# Patient Record
Sex: Female | Born: 1970 | Race: White | Hispanic: No | Marital: Married
Health system: Midwestern US, Academic
[De-identification: ages and names within clinical notes are randomized; demographics above are authoritative.]

---

## 2017-09-16 ENCOUNTER — Encounter: Admit: 2017-09-16 | Discharge: 2017-09-16 | Payer: 59

## 2017-09-16 DIAGNOSIS — S0993XA Unspecified injury of face, initial encounter: ICD-10-CM

## 2017-09-16 DIAGNOSIS — G8911 Acute pain due to trauma: Secondary | ICD-10-CM

## 2017-09-16 MED ORDER — AMPICILLIN/SULBACTAM 3G/100ML NS IVPB (MB+)
3 g | INTRAVENOUS | 0 refills | Status: DC
Start: 2017-09-16 — End: 2017-09-20
  Administered 2017-09-17 – 2017-09-20 (×26): 3 g via INTRAVENOUS

## 2017-09-16 MED ORDER — TETANUS-DIPHTHERIA TOXOIDS-TD 2-2 LF UNIT/0.5 ML IM SUSP
.5 mL | Freq: Once | INTRAMUSCULAR | 0 refills | Status: DC
Start: 2017-09-16 — End: 2017-09-17

## 2017-09-16 MED ORDER — ONDANSETRON HCL (PF) 4 MG/2 ML IJ SOLN
4-8 mg | INTRAVENOUS | 0 refills | Status: DC | PRN
Start: 2017-09-16 — End: 2017-09-21
  Administered 2017-09-17 – 2017-09-19 (×4): 4 mg via INTRAVENOUS

## 2017-09-16 MED ORDER — FENTANYL CITRATE (PF) 50 MCG/ML IJ SOLN
25-50 ug | INTRAVENOUS | 0 refills | Status: DC | PRN
Start: 2017-09-16 — End: 2017-09-17
  Administered 2017-09-17 (×2): 50 ug via INTRAVENOUS

## 2017-09-16 MED ORDER — CETIRIZINE 10 MG PO TAB
10 mg | Freq: Every day | ORAL | 0 refills | Status: DC
Start: 2017-09-16 — End: 2017-09-21
  Administered 2017-09-18 – 2017-09-20 (×3): 10 mg via ORAL

## 2017-09-17 ENCOUNTER — Encounter: Admit: 2017-09-17 | Discharge: 2017-09-17 | Payer: 59

## 2017-09-17 LAB — CBC AND DIFF
Lab: 0 % (ref 0–5)
Lab: 0 % (ref >60)
Lab: 0 10*3/uL (ref 0–0.20)
Lab: 0 10*3/uL (ref 0–0.45)
Lab: 0.8 10*3/uL (ref 0–0.80)
Lab: 1 10*3/uL (ref 1.0–4.8)
Lab: 10 K/UL — ABNORMAL HIGH (ref >60)
Lab: 12 % (ref 11–15)
Lab: 12 10*3/uL — ABNORMAL HIGH (ref 4.5–11.0)
Lab: 13 g/dL — ABNORMAL HIGH (ref 12.0–15.0)
Lab: 197 10*3/uL (ref 150–400)
Lab: 34 g/dL (ref 32.0–36.0)
Lab: 4 M/UL (ref 4.0–5.0)
Lab: 6 % (ref 4–12)
Lab: 8 % — ABNORMAL LOW (ref 24–44)

## 2017-09-17 LAB — PTT (APTT): Lab: 18 s — ABNORMAL LOW (ref 24.0–36.5)

## 2017-09-17 LAB — CBC
Lab: 12 % (ref 11–15)
Lab: 14 K/UL — ABNORMAL HIGH (ref 4.5–11.0)
Lab: 197 K/UL (ref >60)
Lab: 33 pg (ref 26–34)
Lab: 34 g/dL (ref 32.0–36.0)
Lab: 40 % (ref 36–45)

## 2017-09-17 LAB — MAGNESIUM
Lab: 1.9 mg/dL (ref 1.6–2.6)
Lab: 1.9 mg/dL (ref 1.6–2.6)

## 2017-09-17 LAB — COMPREHENSIVE METABOLIC PANEL: Lab: 136 MMOL/L — ABNORMAL LOW (ref 137–147)

## 2017-09-17 LAB — PROTIME INR (PT): Lab: 1 (ref 0.8–1.2)

## 2017-09-17 LAB — PHOSPHORUS
Lab: 2.7 mg/dL (ref 2.0–4.5)
Lab: 3.1 mg/dL (ref 2.0–4.5)

## 2017-09-17 LAB — BASIC METABOLIC PANEL: Lab: 134 MMOL/L — ABNORMAL LOW (ref 137–147)

## 2017-09-17 LAB — IONIZED CALCIUM
Lab: 1 MMOL/L (ref 1.0–1.3)
Lab: 1.1 MMOL/L (ref >60)

## 2017-09-17 LAB — LACTIC ACID(LACTATE): Lab: 1.4 MMOL/L (ref 0.5–2.0)

## 2017-09-17 MED ORDER — PROMETHAZINE 25 MG/ML IJ SOLN
12.5 mg | INTRAVENOUS | 0 refills | Status: DC | PRN
Start: 2017-09-17 — End: 2017-09-19
  Administered 2017-09-17 – 2017-09-19 (×3): 12.5 mg via INTRAVENOUS

## 2017-09-17 MED ORDER — KETOROLAC 30 MG/ML (1 ML) IJ SOLN
30 mg | INTRAVENOUS | 0 refills | Status: CP
Start: 2017-09-17 — End: ?
  Administered 2017-09-17 – 2017-09-18 (×5): 30 mg via INTRAVENOUS

## 2017-09-17 MED ORDER — METHOCARBAMOL 750 MG PO TAB
750 mg | Freq: Two times a day (BID) | ORAL | 0 refills | Status: DC
Start: 2017-09-17 — End: 2017-09-21
  Administered 2017-09-17 – 2017-09-20 (×6): 750 mg via ORAL

## 2017-09-17 MED ORDER — LIDOCAINE-EPINEPHRINE 1 %-1:100,000 IJ SOLN
20 mL | Freq: Once | INTRAMUSCULAR | 0 refills | Status: CP
Start: 2017-09-17 — End: ?
  Administered 2017-09-17: 07:00:00 20 mL via INTRAMUSCULAR

## 2017-09-17 MED ORDER — DIPHTH,PERTUS(ACELL),TETANUS 2.5-8-5 LF-MCG-LF/0.5ML IM SUSP
.5 mL | Freq: Once | INTRAMUSCULAR | 0 refills | Status: CP
Start: 2017-09-17 — End: ?
  Administered 2017-09-17: 15:00:00 0.5 mL via INTRAMUSCULAR

## 2017-09-17 MED ORDER — SENNOSIDES 8.6 MG PO TAB
1 | Freq: Two times a day (BID) | ORAL | 0 refills | Status: DC
Start: 2017-09-17 — End: 2017-09-21
  Administered 2017-09-18 – 2017-09-20 (×4): 1 via ORAL

## 2017-09-17 MED ORDER — HYDROCODONE-ACETAMINOPHEN 5-325 MG PO TAB
1-2 | ORAL | 0 refills | Status: DC | PRN
Start: 2017-09-17 — End: 2017-09-19
  Administered 2017-09-17 – 2017-09-18 (×4): 1 via ORAL
  Administered 2017-09-18: 04:00:00 2 via ORAL
  Administered 2017-09-19 (×2): 1 via ORAL

## 2017-09-17 MED ORDER — ENOXAPARIN 30 MG/0.3 ML SC SYRG
30 mg | Freq: Two times a day (BID) | SUBCUTANEOUS | 0 refills | Status: DC
Start: 2017-09-17 — End: 2017-09-21
  Administered 2017-09-17 – 2017-09-20 (×6): 30 mg via SUBCUTANEOUS

## 2017-09-17 MED ORDER — OXYCODONE 5 MG PO TAB
5-10 mg | ORAL | 0 refills | Status: DC | PRN
Start: 2017-09-17 — End: 2017-09-17
  Administered 2017-09-17: 12:00:00 5 mg via ORAL

## 2017-09-17 MED ORDER — POTASSIUM PHOSPHATE IVPB-STD
15 MMOL | Freq: Once | INTRAVENOUS | 0 refills | Status: CP
Start: 2017-09-17 — End: ?
  Administered 2017-09-17 (×2): 15 mmol via INTRAVENOUS

## 2017-09-17 MED ORDER — FENTANYL CITRATE (PF) 50 MCG/ML IJ SOLN
25-50 ug | INTRAVENOUS | 0 refills | Status: DC | PRN
Start: 2017-09-17 — End: 2017-09-17

## 2017-09-17 MED ORDER — POTASSIUM CHLORIDE 20 MEQ PO TBTQ
20 meq | Freq: Once | ORAL | 0 refills | Status: CP
Start: 2017-09-17 — End: ?
  Administered 2017-09-17: 13:00:00 20 meq via ORAL

## 2017-09-17 MED ORDER — FENTANYL CITRATE (PF) 50 MCG/ML IJ SOLN
25 ug | INTRAVENOUS | 0 refills | Status: DC | PRN
Start: 2017-09-17 — End: 2017-09-17

## 2017-09-17 MED ORDER — WATER FOR IRRIGATION, STERILE IR SOLN
Freq: Once | 0 refills | Status: CP
Start: 2017-09-17 — End: ?
  Administered 2017-09-17: 07:00:00 1000.000 mL

## 2017-09-17 MED ORDER — ACETAMINOPHEN 325 MG PO TAB
650 mg | ORAL | 0 refills | Status: DC
Start: 2017-09-17 — End: 2017-09-17
  Administered 2017-09-17 (×2): 650 mg via ORAL

## 2017-09-17 MED ORDER — POLYETHYLENE GLYCOL 3350 17 GRAM PO PWPK
1 | Freq: Every day | ORAL | 0 refills | Status: DC
Start: 2017-09-17 — End: 2017-09-21
  Administered 2017-09-20: 14:00:00 17 g via ORAL

## 2017-09-17 MED ORDER — SCOPOLAMINE BASE 1 MG OVER 3 DAYS TD PT3D
1 | TRANSDERMAL | 0 refills | Status: DC
Start: 2017-09-17 — End: 2017-09-20
  Administered 2017-09-17: 19:00:00 1 via TRANSDERMAL

## 2017-09-17 MED ADMIN — SODIUM CHLORIDE 0.9 % IV SOLP [27838]: 1000 mL | INTRAVENOUS | @ 07:00:00 | Stop: 2017-09-17 | NDC 00338004904

## 2017-09-18 ENCOUNTER — Inpatient Hospital Stay: Admit: 2017-09-19 | Discharge: 2017-09-19 | Payer: 59

## 2017-09-18 ENCOUNTER — Encounter: Admit: 2017-09-18 | Discharge: 2017-09-18 | Payer: 59

## 2017-09-18 DIAGNOSIS — S02652B Fracture of angle of left mandible, initial encounter for open fracture: Principal | ICD-10-CM

## 2017-09-19 ENCOUNTER — Inpatient Hospital Stay: Admit: 2017-09-19 | Discharge: 2017-09-19 | Payer: 59

## 2017-09-19 ENCOUNTER — Encounter: Admit: 2017-09-19 | Discharge: 2017-09-19 | Payer: 59

## 2017-09-19 ENCOUNTER — Inpatient Hospital Stay
Admit: 2017-09-17 | Discharge: 2017-09-20 | Disposition: A | Discharge status: Home / Self Care | Source: Other Acute Inpatient Hospital

## 2017-09-19 DIAGNOSIS — S02652B Fracture of angle of left mandible, initial encounter for open fracture: Principal | ICD-10-CM

## 2017-09-19 LAB — PREGNANCY TEST-URINE
Lab: 1
Lab: NEGATIVE

## 2017-09-19 MED ORDER — DEXTRAN 70-HYPROMELLOSE (PF) 0.1-0.3 % OP DPET
0 refills | Status: DC
Start: 2017-09-19 — End: 2017-09-19
  Administered 2017-09-19: 22:00:00 2 [drp] via OPHTHALMIC

## 2017-09-19 MED ORDER — DEXAMETHASONE SODIUM PHOSPHATE 4 MG/ML IJ SOLN
INTRAVENOUS | 0 refills | Status: DC
Start: 2017-09-19 — End: 2017-09-19
  Administered 2017-09-19: 22:00:00 4 mg via INTRAVENOUS

## 2017-09-19 MED ORDER — BACITRACIN ZINC 500 UNIT/GRAM TP OINT
0 refills | Status: DC
Start: 2017-09-19 — End: 2017-09-19
  Administered 2017-09-19: 23:00:00 1 via TOPICAL

## 2017-09-19 MED ORDER — HYDRALAZINE 20 MG/ML IJ SOLN
10 mg | Freq: Once | INTRAVENOUS | 0 refills | Status: CP
Start: 2017-09-19 — End: ?
  Administered 2017-09-20: 05:00:00 10 mg via INTRAVENOUS

## 2017-09-19 MED ORDER — BACITRACIN 50,000 UNIT IM SOLR
0 refills | Status: DC
Start: 2017-09-19 — End: 2017-09-19
  Administered 2017-09-19: 23:00:00 50000 [IU]

## 2017-09-19 MED ORDER — TRAMADOL 50 MG PO TAB
50-100 mg | ORAL | 0 refills | Status: DC | PRN
Start: 2017-09-19 — End: 2017-09-21
  Administered 2017-09-20: 18:00:00 100 mg via ORAL
  Administered 2017-09-20 (×2): 50 mg via ORAL

## 2017-09-19 MED ORDER — MIDAZOLAM 1 MG/ML IJ SOLN
INTRAVENOUS | 0 refills | Status: DC
Start: 2017-09-19 — End: 2017-09-19
  Administered 2017-09-19: 22:00:00 2 mg via INTRAVENOUS

## 2017-09-19 MED ORDER — DIPHENHYDRAMINE HCL 50 MG/ML IJ SOLN
25 mg | Freq: Once | INTRAVENOUS | 0 refills | Status: DC | PRN
Start: 2017-09-19 — End: 2017-09-20

## 2017-09-19 MED ORDER — ROCURONIUM 10 MG/ML IV SOLN
INTRAVENOUS | 0 refills | Status: DC
Start: 2017-09-19 — End: 2017-09-19
  Administered 2017-09-19: 22:00:00 30 mg via INTRAVENOUS

## 2017-09-19 MED ORDER — HALOPERIDOL LACTATE 5 MG/ML IJ SOLN
1 mg | Freq: Once | INTRAVENOUS | 0 refills | Status: CP | PRN
Start: 2017-09-19 — End: ?
  Administered 2017-09-19: 23:00:00 1 mg via INTRAVENOUS

## 2017-09-19 MED ORDER — ACETAMINOPHEN 325 MG PO TAB
650 mg | ORAL | 0 refills | Status: DC
Start: 2017-09-19 — End: 2017-09-21
  Administered 2017-09-20 (×4): 650 mg via ORAL

## 2017-09-19 MED ORDER — LABETALOL 5 MG/ML IV SYRG
5 mg | Freq: Once | INTRAVENOUS | 0 refills | Status: CP
Start: 2017-09-19 — End: ?
  Administered 2017-09-20: 01:00:00 5 mg via INTRAVENOUS

## 2017-09-19 MED ORDER — LIDOCAINE/BUPIVACAINE/EPINEPHRINE SYR (KRIET) (OR)
0 refills | Status: DC
Start: 2017-09-19 — End: 2017-09-19
  Administered 2017-09-19 (×3): 3 mL via INTRAMUSCULAR

## 2017-09-19 MED ORDER — ONDANSETRON HCL (PF) 4 MG/2 ML IJ SOLN
INTRAVENOUS | 0 refills | Status: DC
Start: 2017-09-19 — End: 2017-09-19
  Administered 2017-09-19: 22:00:00 4 mg via INTRAVENOUS

## 2017-09-19 MED ORDER — HYDROMORPHONE (PF) 2 MG/ML IJ SYRG
1-2 mg | Freq: Once | INTRAVENOUS | 0 refills | Status: CP | PRN
Start: 2017-09-19 — End: ?
  Administered 2017-09-19: 23:00:00 1 mg via INTRAVENOUS

## 2017-09-19 MED ORDER — ACETAMINOPHEN 325 MG PO TAB
650 mg | ORAL | 0 refills | Status: DC | PRN
Start: 2017-09-19 — End: 2017-09-20
  Administered 2017-09-19: 21:00:00 650 mg via ORAL

## 2017-09-19 MED ORDER — REMIFENTANYL 1000MCG IN NS 20ML (OR)
0 refills | Status: DC
Start: 2017-09-19 — End: 2017-09-19
  Administered 2017-09-19 (×2): .05 ug/kg/min via INTRAVENOUS

## 2017-09-19 MED ORDER — LACTATED RINGERS IV SOLP
1000 mL | INTRAVENOUS | 0 refills | Status: DC
Start: 2017-09-19 — End: 2017-09-20

## 2017-09-19 MED ORDER — ACETAMINOPHEN 325 MG PO TAB
650 mg | ORAL | 0 refills | Status: DC | PRN
Start: 2017-09-19 — End: 2017-09-19
  Administered 2017-09-19: 15:00:00 650 mg via ORAL

## 2017-09-19 MED ORDER — DEXMEDETOMIDINE# 4MCG/ML IV SOLN
0 refills | Status: DC
Start: 2017-09-19 — End: 2017-09-19
  Administered 2017-09-19: 22:00:00 12 ug via INTRAVENOUS
  Administered 2017-09-19: 22:00:00 8 ug via INTRAVENOUS

## 2017-09-19 MED ORDER — PROMETHAZINE 25 MG PO TAB
25 mg | ORAL | 0 refills | Status: DC | PRN
Start: 2017-09-19 — End: 2017-09-21
  Administered 2017-09-20 (×2): 25 mg via ORAL

## 2017-09-19 MED ORDER — PROMETHAZINE 25 MG/ML IJ SOLN
6.25 mg | INTRAVENOUS | 0 refills | Status: DC | PRN
Start: 2017-09-19 — End: 2017-09-20

## 2017-09-19 MED ORDER — PROPOFOL INJ 10 MG/ML IV VIAL
0 refills | Status: DC
Start: 2017-09-19 — End: 2017-09-19
  Administered 2017-09-19 (×4): 50 mg via INTRAVENOUS

## 2017-09-19 MED ORDER — FENTANYL CITRATE (PF) 50 MCG/ML IJ SOLN
25 ug | INTRAVENOUS | 0 refills | Status: DC | PRN
Start: 2017-09-19 — End: 2017-09-20

## 2017-09-19 MED ORDER — SUGAMMADEX 100 MG/ML IV SOLN
INTRAVENOUS | 0 refills | Status: DC
Start: 2017-09-19 — End: 2017-09-19
  Administered 2017-09-19: 23:00:00 133 mg via INTRAVENOUS

## 2017-09-19 MED ORDER — PROPOFOL 10 MG/ML IV EMUL (INFUSION)(AM)(OR)
0 refills | Status: DC
Start: 2017-09-19 — End: 2017-09-19
  Administered 2017-09-19: 22:00:00 150 ug/kg/min via INTRAVENOUS

## 2017-09-19 MED ORDER — PHENYLEPHRINE HCL 1 % NA SPRY
0 refills | Status: DC
Start: 2017-09-19 — End: 2017-09-19
  Administered 2017-09-19: 22:00:00 2 via NASAL

## 2017-09-19 MED ORDER — FENTANYL CITRATE (PF) 50 MCG/ML IJ SOLN
50 ug | INTRAVENOUS | 0 refills | Status: DC | PRN
Start: 2017-09-19 — End: 2017-09-20
  Administered 2017-09-19 – 2017-09-20 (×4): 50 ug via INTRAVENOUS

## 2017-09-19 MED ORDER — APREPITANT 125 MG PO CAP
125 mg | Freq: Once | ORAL | 0 refills | Status: CP
Start: 2017-09-19 — End: ?
  Administered 2017-09-19: 21:00:00 125 mg via ORAL

## 2017-09-19 MED ORDER — FENTANYL CITRATE (PF) 50 MCG/ML IJ SOLN
25-50 ug | INTRAVENOUS | 0 refills | Status: DC | PRN
Start: 2017-09-19 — End: 2017-09-20
  Administered 2017-09-20: 05:00:00 50 ug via INTRAVENOUS
  Administered 2017-09-20: 02:00:00 25 ug via INTRAVENOUS
  Administered 2017-09-20: 13:00:00 50 ug via INTRAVENOUS

## 2017-09-19 MED ADMIN — LACTATED RINGERS IV SOLP [4318]: 1000 mL | INTRAVENOUS | @ 21:00:00 | Stop: 2017-09-19 | NDC 00338011704

## 2017-09-20 ENCOUNTER — Encounter: Admit: 2017-09-20 | Discharge: 2017-09-20 | Payer: 59

## 2017-09-20 ENCOUNTER — Inpatient Hospital Stay: Admit: 2017-09-18 | Discharge: 2017-09-18 | Payer: 59

## 2017-09-20 DIAGNOSIS — G8911 Acute pain due to trauma: ICD-10-CM

## 2017-09-20 DIAGNOSIS — S0181XA Laceration without foreign body of other part of head, initial encounter: ICD-10-CM

## 2017-09-20 DIAGNOSIS — S060X0A Concussion without loss of consciousness, initial encounter: ICD-10-CM

## 2017-09-20 DIAGNOSIS — M264 Malocclusion, unspecified: ICD-10-CM

## 2017-09-20 DIAGNOSIS — S02652B Fracture of angle of left mandible, initial encounter for open fracture: Principal | ICD-10-CM

## 2017-09-20 MED ORDER — METHOCARBAMOL 750 MG PO TAB
750 mg | ORAL_TABLET | Freq: Three times a day (TID) | ORAL | 0 refills | Status: SS | PRN
Start: 2017-09-20 — End: 2018-04-04

## 2017-09-20 MED ORDER — METHOCARBAMOL 750 MG PO TAB
750 mg | ORAL_TABLET | Freq: Three times a day (TID) | ORAL | 0 refills | Status: AC | PRN
Start: 2017-09-20 — End: 2017-09-20
  Filled 2017-09-20 (×2): qty 15, 5d supply, fill #1

## 2017-09-20 MED ORDER — CEPHALEXIN 500 MG PO CAP
500 mg | ORAL_CAPSULE | Freq: Four times a day (QID) | ORAL | 0 refills | Status: AC
Start: 2017-09-20 — End: 2017-09-20
  Filled 2017-09-20 (×2): qty 28, 7d supply, fill #1

## 2017-09-20 MED ORDER — TRAMADOL 50 MG PO TAB
50-100 mg | ORAL_TABLET | ORAL | 0 refills | Status: AC | PRN
Start: 2017-09-20 — End: 2018-01-12
  Filled 2017-09-20 (×2): qty 45, 7d supply, fill #1

## 2017-09-20 MED ORDER — CHLORHEXIDINE GLUCONATE 0.12 % MM MWSH
15 mL | Freq: After meals | ORAL | 0 refills | 23.00000 days | Status: AC
Start: 2017-09-20 — End: 2017-09-20

## 2017-09-20 MED ORDER — PROMETHAZINE 25 MG PO TAB
25 mg | ORAL_TABLET | ORAL | 0 refills | 7.00000 days | Status: AC | PRN
Start: 2017-09-20 — End: 2018-01-12

## 2017-09-20 MED ORDER — SENNOSIDES 8.6 MG PO TAB
1 | ORAL_TABLET | Freq: Two times a day (BID) | ORAL | 3 refills | Status: AC
Start: 2017-09-20 — End: 2018-01-12

## 2017-09-20 MED ORDER — CHLORHEXIDINE GLUCONATE 0.12 % MM MWSH
15 mL | Freq: After meals | ORAL | 0 refills | 23.00000 days | Status: AC
Start: 2017-09-20 — End: 2018-01-12
  Filled 2017-09-20 (×2): qty 473, 8d supply, fill #1

## 2017-09-20 MED ORDER — POLYETHYLENE GLYCOL 3350 17 GRAM PO PWPK
17 g | Freq: Every day | ORAL | 0 refills | 18.00000 days | Status: AC
Start: 2017-09-20 — End: 2018-01-12

## 2017-09-20 MED ORDER — KETOROLAC 30 MG/ML (1 ML) IJ SOLN
30 mg | INTRAVENOUS | 0 refills | Status: DC
Start: 2017-09-20 — End: 2017-09-21
  Administered 2017-09-20: 14:00:00 30 mg via INTRAVENOUS

## 2017-09-20 MED ORDER — CEPHALEXIN 500 MG PO CAP
500 mg | ORAL_CAPSULE | Freq: Four times a day (QID) | ORAL | 0 refills | Status: AC
Start: 2017-09-20 — End: ?

## 2017-09-20 MED ORDER — CHLORHEXIDINE GLUCONATE 0.12 % MM MWSH
15 mL | Freq: After meals | ORAL | 0 refills | Status: DC
Start: 2017-09-20 — End: 2017-09-21
  Administered 2017-09-20 (×2): 15 mL via ORAL

## 2017-09-20 MED ORDER — CEPHALEXIN 500 MG PO CAP
500 mg | Freq: Four times a day (QID) | ORAL | 0 refills | Status: DC
Start: 2017-09-20 — End: 2017-09-21
  Administered 2017-09-20 (×2): 500 mg via ORAL

## 2017-09-20 MED ORDER — PROMETHAZINE 25 MG PO TAB
25 mg | ORAL_TABLET | ORAL | 0 refills | 7.00000 days | Status: AC | PRN
Start: 2017-09-20 — End: 2017-09-20
  Filled 2017-09-20 (×2): qty 20, 5d supply, fill #1

## 2017-09-20 MED ORDER — ACETAMINOPHEN 325 MG PO TAB
650 mg | ORAL | 0 refills | Status: AC | PRN
Start: 2017-09-20 — End: ?

## 2017-09-21 ENCOUNTER — Encounter: Admit: 2017-09-21 | Discharge: 2017-09-21 | Payer: 59

## 2017-09-22 ENCOUNTER — Encounter: Admit: 2017-09-22 | Discharge: 2017-09-22 | Payer: 59

## 2017-09-22 ENCOUNTER — Observation Stay: Admit: 2017-09-22 | Discharge: 2017-09-23

## 2017-09-22 ENCOUNTER — Emergency Department: Admit: 2017-09-22 | Discharge: 2017-09-23 | Payer: 59

## 2017-09-22 ENCOUNTER — Emergency Department: Admit: 2017-09-22 | Discharge: 2017-09-22 | Payer: 59

## 2017-09-22 DIAGNOSIS — Z9889 Other specified postprocedural states: ICD-10-CM

## 2017-09-22 LAB — COMPREHENSIVE METABOLIC PANEL
Lab: 0.4 mg/dL (ref 0.3–1.2)
Lab: 0.7 mg/dL (ref 0.4–1.00)
Lab: 100 MMOL/L (ref 98–110)
Lab: 118 mg/dL — ABNORMAL HIGH (ref 70–100)
Lab: 13 mg/dL (ref 7–25)
Lab: 135 MMOL/L — ABNORMAL LOW (ref 137–147)
Lab: 4 g/dL (ref 3.5–5.0)
Lab: 4.4 MMOL/L — ABNORMAL HIGH (ref 3.5–5.1)
Lab: 7.5 g/dL (ref 6.0–8.0)
Lab: 9.8 mg/dL (ref 8.5–10.6)

## 2017-09-22 LAB — CBC AND DIFF: Lab: 8.5 10*3/uL (ref 4.5–11.0)

## 2017-09-22 MED ORDER — PROCHLORPERAZINE EDISYLATE 5 MG/ML IJ SOLN
5 mg | INTRAVENOUS | 0 refills | Status: DC | PRN
Start: 2017-09-22 — End: 2017-09-23

## 2017-09-22 MED ORDER — SENNOSIDES 8.6 MG PO TAB
1 | Freq: Two times a day (BID) | ORAL | 0 refills | Status: DC
Start: 2017-09-22 — End: 2017-09-23
  Administered 2017-09-23 (×2): 1 via ORAL

## 2017-09-22 MED ORDER — MELATONIN 1 MG PO TAB
1 mg | Freq: Every evening | ORAL | 0 refills | Status: DC | PRN
Start: 2017-09-22 — End: 2017-09-23

## 2017-09-22 MED ORDER — DIPHENHYDRAMINE HCL 50 MG/ML IJ SOLN
25 mg | Freq: Once | INTRAVENOUS | 0 refills | Status: CP
Start: 2017-09-22 — End: ?
  Administered 2017-09-23: 02:00:00 25 mg via INTRAVENOUS

## 2017-09-22 MED ORDER — CETIRIZINE 10 MG PO TAB
10 mg | Freq: Every morning | ORAL | 0 refills | Status: DC
Start: 2017-09-22 — End: 2017-09-23
  Administered 2017-09-23: 17:00:00 10 mg via ORAL

## 2017-09-22 MED ORDER — SODIUM CHLORIDE 0.9 % IJ SOLN
50 mL | Freq: Once | INTRAVENOUS | 0 refills | Status: CP
Start: 2017-09-22 — End: ?
  Administered 2017-09-22: 21:00:00 50 mL via INTRAVENOUS

## 2017-09-22 MED ORDER — POLYETHYLENE GLYCOL 3350 17 GRAM PO PWPK
17 g | Freq: Every day | ORAL | 0 refills | Status: DC
Start: 2017-09-22 — End: 2017-09-23
  Administered 2017-09-23: 17:00:00 17 g via ORAL

## 2017-09-22 MED ORDER — IOHEXOL 350 MG IODINE/ML IV SOLN
65 mL | Freq: Once | INTRAVENOUS | 0 refills | Status: CP
Start: 2017-09-22 — End: ?
  Administered 2017-09-22: 21:00:00 65 mL via INTRAVENOUS

## 2017-09-22 MED ORDER — METHOCARBAMOL 750 MG PO TAB
750 mg | Freq: Three times a day (TID) | ORAL | 0 refills | Status: DC | PRN
Start: 2017-09-22 — End: 2017-09-23
  Administered 2017-09-23: 17:00:00 750 mg via ORAL

## 2017-09-22 MED ORDER — ACETAMINOPHEN 325 MG PO TAB
650 mg | Freq: Once | ORAL | 0 refills | Status: CP
Start: 2017-09-22 — End: ?
  Administered 2017-09-22: 19:00:00 650 mg via ORAL

## 2017-09-22 MED ORDER — PROCHLORPERAZINE EDISYLATE 5 MG/ML IJ SOLN
5-10 mg | Freq: Once | INTRAVENOUS | 0 refills | Status: CP
Start: 2017-09-22 — End: ?
  Administered 2017-09-23: 02:00:00 5 mg via INTRAVENOUS

## 2017-09-22 MED ORDER — TRAMADOL 50 MG PO TAB
50 mg | Freq: Once | ORAL | 0 refills | Status: CP
Start: 2017-09-22 — End: ?
  Administered 2017-09-23: 02:00:00 50 mg via ORAL

## 2017-09-22 MED ORDER — TRAMADOL 50 MG PO TAB
50-100 mg | ORAL | 0 refills | Status: DC | PRN
Start: 2017-09-22 — End: 2017-09-23
  Administered 2017-09-23: 17:00:00 50 mg via ORAL

## 2017-09-22 MED ORDER — AMLODIPINE 5 MG PO TAB
5 mg | Freq: Once | ORAL | 0 refills | Status: CP
Start: 2017-09-22 — End: ?
  Administered 2017-09-23: 03:00:00 5 mg via ORAL

## 2017-09-22 MED ORDER — KETOROLAC 15 MG/ML IJ SOLN
15 mg | INTRAVENOUS | 0 refills | Status: DC | PRN
Start: 2017-09-22 — End: 2017-09-23

## 2017-09-22 MED ORDER — CEPHALEXIN 500 MG PO CAP
500 mg | Freq: Four times a day (QID) | ORAL | 0 refills | Status: DC
Start: 2017-09-22 — End: 2017-09-23
  Administered 2017-09-23 (×2): 500 mg via ORAL

## 2017-09-22 MED ORDER — DIPHENHYDRAMINE HCL 50 MG/ML IJ SOLN
12.5-25 mg | INTRAVENOUS | 0 refills | Status: DC | PRN
Start: 2017-09-22 — End: 2017-09-23

## 2017-09-22 MED ORDER — GADOBENATE DIMEGLUMINE 529 MG/ML (0.1MMOL/0.2ML) IV SOLN
12 mL | Freq: Once | INTRAVENOUS | 0 refills | Status: CP
Start: 2017-09-22 — End: ?
  Administered 2017-09-23: 01:00:00 12 mL via INTRAVENOUS

## 2017-09-22 MED ORDER — CHLORHEXIDINE GLUCONATE 0.12 % MM MWSH
15 mL | Freq: After meals | ORAL | 0 refills | Status: DC
Start: 2017-09-22 — End: 2017-09-23
  Administered 2017-09-23 (×3): 15 mL via ORAL

## 2017-09-22 MED ORDER — ACETAMINOPHEN 325 MG PO TAB
650 mg | ORAL | 0 refills | Status: DC | PRN
Start: 2017-09-22 — End: 2017-09-23
  Administered 2017-09-23 (×2): 650 mg via ORAL

## 2017-09-23 DIAGNOSIS — R51 Headache: ICD-10-CM

## 2017-09-23 DIAGNOSIS — H4922 Sixth [abducent] nerve palsy, left eye: Principal | ICD-10-CM

## 2017-09-23 DIAGNOSIS — S060X1D Concussion with loss of consciousness of 30 minutes or less, subsequent encounter: ICD-10-CM

## 2017-09-23 DIAGNOSIS — S02609D Fracture of mandible, unspecified, subsequent encounter for fracture with routine healing: ICD-10-CM

## 2017-09-23 DIAGNOSIS — R200 Anesthesia of skin: ICD-10-CM

## 2017-09-23 DIAGNOSIS — I7771 Dissection of carotid artery: ICD-10-CM

## 2017-09-23 MED ORDER — CLOPIDOGREL 75 MG PO TAB
75 mg | ORAL_TABLET | Freq: Every day | ORAL | 0 refills | 90.00000 days | Status: AC
Start: 2017-09-23 — End: ?

## 2017-09-23 MED ORDER — ASPIRIN 325 MG PO TAB
325 mg | Freq: Once | ORAL | 0 refills | Status: CP
Start: 2017-09-23 — End: ?
  Administered 2017-09-23: 18:00:00 325 mg via ORAL

## 2017-09-23 MED ORDER — AMLODIPINE 10 MG PO TAB
5 mg | ORAL_TABLET | Freq: Every day | ORAL | 0 refills | Status: AC
Start: 2017-09-23 — End: ?

## 2017-09-23 MED ORDER — CLOPIDOGREL 75 MG PO TAB
75 mg | Freq: Every day | ORAL | 0 refills | Status: DC
Start: 2017-09-23 — End: 2017-09-23
  Administered 2017-09-23: 18:00:00 75 mg via ORAL

## 2017-09-23 MED ORDER — ASPIRIN 81 MG PO TBEC
81 mg | ORAL_TABLET | Freq: Every day | ORAL | 0 refills | Status: AC
Start: 2017-09-23 — End: ?

## 2017-10-13 ENCOUNTER — Ambulatory Visit: Admit: 2017-10-13 | Discharge: 2017-10-13

## 2017-10-13 ENCOUNTER — Encounter: Admit: 2017-10-13 | Discharge: 2017-10-13 | Payer: 59

## 2017-10-13 DIAGNOSIS — S02602D Fracture of unspecified part of body of left mandible, subsequent encounter for fracture with routine healing: ICD-10-CM

## 2017-10-13 DIAGNOSIS — H4922 Sixth [abducent] nerve palsy, left eye: Principal | ICD-10-CM

## 2017-10-14 ENCOUNTER — Encounter: Admit: 2017-10-14 | Discharge: 2017-10-14 | Payer: 59

## 2017-10-20 ENCOUNTER — Encounter: Admit: 2017-10-20 | Discharge: 2017-10-20 | Payer: 59

## 2018-01-12 ENCOUNTER — Ambulatory Visit: Admit: 2018-01-12 | Discharge: 2018-01-13

## 2018-01-12 ENCOUNTER — Encounter: Admit: 2018-01-12 | Discharge: 2018-01-12 | Payer: 59

## 2018-01-13 DIAGNOSIS — H4921 Sixth [abducent] nerve palsy, right eye: Principal | ICD-10-CM

## 2018-02-02 ENCOUNTER — Ambulatory Visit: Admit: 2018-02-02 | Discharge: 2018-02-03

## 2018-02-02 ENCOUNTER — Encounter: Admit: 2018-02-02 | Discharge: 2018-02-02 | Payer: 59

## 2018-02-02 DIAGNOSIS — A64 Unspecified sexually transmitted disease: ICD-10-CM

## 2018-02-02 DIAGNOSIS — I10 Essential (primary) hypertension: Principal | ICD-10-CM

## 2018-02-03 DIAGNOSIS — I6930 Unspecified sequelae of cerebral infarction: ICD-10-CM

## 2018-02-03 DIAGNOSIS — H4921 Sixth [abducent] nerve palsy, right eye: Principal | ICD-10-CM

## 2018-02-05 ENCOUNTER — Encounter: Admit: 2018-02-05 | Discharge: 2018-02-05 | Payer: 59

## 2018-02-05 DIAGNOSIS — I10 Essential (primary) hypertension: Principal | ICD-10-CM

## 2018-02-05 DIAGNOSIS — A64 Unspecified sexually transmitted disease: ICD-10-CM

## 2018-02-06 ENCOUNTER — Encounter: Admit: 2018-02-06 | Discharge: 2018-02-06 | Payer: 59

## 2018-02-23 ENCOUNTER — Encounter: Admit: 2018-02-23 | Discharge: 2018-02-23 | Payer: 59

## 2018-02-27 ENCOUNTER — Encounter: Admit: 2018-02-27 | Discharge: 2018-02-27 | Payer: 59

## 2018-02-27 ENCOUNTER — Encounter: Admit: 2018-02-27 | Discharge: 2018-02-28 | Payer: 59

## 2018-03-06 ENCOUNTER — Encounter: Admit: 2018-03-06 | Discharge: 2018-03-06 | Payer: 59

## 2018-03-07 ENCOUNTER — Encounter: Admit: 2018-03-07 | Discharge: 2018-03-07 | Payer: 59

## 2018-03-07 DIAGNOSIS — I6930 Unspecified sequelae of cerebral infarction: ICD-10-CM

## 2018-03-07 DIAGNOSIS — H4921 Sixth [abducent] nerve palsy, right eye: Principal | ICD-10-CM

## 2018-03-08 ENCOUNTER — Encounter: Admit: 2018-03-08 | Discharge: 2018-03-08 | Payer: 59

## 2018-03-08 DIAGNOSIS — I772 Rupture of artery: Principal | ICD-10-CM

## 2018-04-04 ENCOUNTER — Encounter: Admit: 2018-04-04 | Discharge: 2018-04-04 | Payer: 59

## 2018-04-04 ENCOUNTER — Ambulatory Visit: Admit: 2018-04-04 | Discharge: 2018-04-04

## 2018-04-04 DIAGNOSIS — Z885 Allergy status to narcotic agent status: ICD-10-CM

## 2018-04-04 DIAGNOSIS — I6930 Unspecified sequelae of cerebral infarction: Secondary | ICD-10-CM

## 2018-04-04 DIAGNOSIS — Z79899 Other long term (current) drug therapy: ICD-10-CM

## 2018-04-04 DIAGNOSIS — I772 Rupture of artery: Secondary | ICD-10-CM

## 2018-04-04 DIAGNOSIS — H4921 Sixth [abducent] nerve palsy, right eye: ICD-10-CM

## 2018-04-04 DIAGNOSIS — Z7982 Long term (current) use of aspirin: ICD-10-CM

## 2018-04-04 DIAGNOSIS — I770 Arteriovenous fistula, acquired: Principal | ICD-10-CM

## 2018-04-04 DIAGNOSIS — I671 Cerebral aneurysm, nonruptured: Principal | ICD-10-CM

## 2018-04-04 DIAGNOSIS — I10 Essential (primary) hypertension: ICD-10-CM

## 2018-04-04 DIAGNOSIS — Z91040 Latex allergy status: ICD-10-CM

## 2018-04-04 MED ORDER — FENTANYL CITRATE (PF) 50 MCG/ML IJ SOLN
0 refills | Status: CP
Start: 2018-04-04 — End: ?
  Administered 2018-04-04: 16:00:00 25 ug via INTRAVENOUS
  Administered 2018-04-04: 16:00:00 50 ug via INTRAVENOUS
  Administered 2018-04-04 (×3): 25 ug via INTRAVENOUS

## 2018-04-04 MED ORDER — MIDAZOLAM 1 MG/ML IJ SOLN
1-2 mg | Freq: Once | INTRAVENOUS | 0 refills | Status: CP
Start: 2018-04-04 — End: ?
  Administered 2018-04-04: 15:00:00 1 mg via INTRAVENOUS

## 2018-04-04 MED ORDER — FENTANYL CITRATE (PF) 50 MCG/ML IJ SOLN
25-50 ug | Freq: Once | INTRAVENOUS | 0 refills | Status: CP
Start: 2018-04-04 — End: ?
  Administered 2018-04-04: 15:00:00 50 ug via INTRAVENOUS

## 2018-04-04 MED ORDER — IOPAMIDOL 61 % IV SOLN
175 mL | Freq: Once | INTRA_ARTERIAL | 0 refills | Status: CP
Start: 2018-04-04 — End: ?
  Administered 2018-04-04: 16:00:00 175 mL via INTRA_ARTERIAL

## 2018-04-04 MED ORDER — ACETAMINOPHEN 500 MG PO TAB
500 mg | Freq: Once | ORAL | 0 refills | Status: CP
Start: 2018-04-04 — End: ?
  Administered 2018-04-04: 18:00:00 500 mg via ORAL

## 2018-04-04 MED ORDER — MIDAZOLAM 1 MG/ML IJ SOLN
0 refills | Status: CP
Start: 2018-04-04 — End: ?
  Administered 2018-04-04 (×5): 0.5 mg via INTRAVENOUS

## 2018-04-20 ENCOUNTER — Ambulatory Visit: Admit: 2018-04-20 | Discharge: 2018-04-20

## 2018-04-20 ENCOUNTER — Encounter: Admit: 2018-04-20 | Discharge: 2018-04-20 | Payer: 59

## 2018-04-20 DIAGNOSIS — A64 Unspecified sexually transmitted disease: ICD-10-CM

## 2018-04-20 DIAGNOSIS — I671 Cerebral aneurysm, nonruptured: Principal | ICD-10-CM

## 2018-04-20 DIAGNOSIS — I10 Essential (primary) hypertension: Principal | ICD-10-CM

## 2018-04-20 MED ORDER — FENTANYL CITRATE (PF) 50 MCG/ML IJ SOLN
0 refills | Status: CP
Start: 2018-04-20 — End: ?
  Administered 2018-04-20: 17:00:00 50 ug via INTRAVENOUS
  Administered 2018-04-20 (×2): 25 ug via INTRAVENOUS

## 2018-04-20 MED ORDER — HEPARIN (PORCINE) 1,000 UNIT/ML IJ SOLN
4500 [IU] | Freq: Once | INTRAVENOUS | 0 refills | Status: CP
Start: 2018-04-20 — End: ?

## 2018-04-20 MED ORDER — IOPAMIDOL 61 % IV SOLN
90 mL | Freq: Once | INTRA_ARTERIAL | 0 refills | Status: CP
Start: 2018-04-20 — End: ?
  Administered 2018-04-20: 17:00:00 90 mL via INTRA_ARTERIAL

## 2018-04-20 MED ORDER — FENTANYL CITRATE (PF) 50 MCG/ML IJ SOLN
25-50 ug | Freq: Once | INTRAVENOUS | 0 refills | Status: CP
Start: 2018-04-20 — End: ?
  Administered 2018-04-20: 16:00:00 50 ug via INTRAVENOUS

## 2018-04-20 MED ORDER — METHYLPREDNISOLONE SOD SUC(PF) 125 MG/2 ML IJ SOLR
62.5 mg | Freq: Once | INTRAVENOUS | 0 refills | Status: CP
Start: 2018-04-20 — End: ?
  Administered 2018-04-20: 15:00:00 62.5 mg via INTRAVENOUS

## 2018-04-20 MED ORDER — DIPHENHYDRAMINE HCL 50 MG/ML IJ SOLN
50 mg | Freq: Once | INTRAVENOUS | 0 refills | Status: CP
Start: 2018-04-20 — End: ?
  Administered 2018-04-20: 15:00:00 50 mg via INTRAVENOUS

## 2018-04-20 MED ORDER — MIDAZOLAM 1 MG/ML IJ SOLN
1-2 mg | Freq: Once | INTRAVENOUS | 0 refills | Status: CP
Start: 2018-04-20 — End: ?
  Administered 2018-04-20: 16:00:00 1 mg via INTRAVENOUS

## 2018-04-20 MED ORDER — MIDAZOLAM 1 MG/ML IJ SOLN
0 refills | Status: CP
Start: 2018-04-20 — End: ?
  Administered 2018-04-20 (×2): 0.5 mg via INTRAVENOUS

## 2018-05-05 ENCOUNTER — Ambulatory Visit: Admit: 2018-05-05 | Discharge: 2018-05-06

## 2018-05-05 ENCOUNTER — Encounter: Admit: 2018-05-05 | Discharge: 2018-05-05 | Payer: 59

## 2018-05-05 ENCOUNTER — Ambulatory Visit: Admit: 2018-05-05 | Discharge: 2018-05-05 | Payer: 59

## 2018-05-05 DIAGNOSIS — I10 Essential (primary) hypertension: Principal | ICD-10-CM

## 2018-05-05 DIAGNOSIS — G459 Transient cerebral ischemic attack, unspecified: ICD-10-CM

## 2018-05-05 DIAGNOSIS — T783XXA Angioneurotic edema, initial encounter: ICD-10-CM

## 2018-05-05 DIAGNOSIS — A64 Unspecified sexually transmitted disease: ICD-10-CM

## 2018-05-05 DIAGNOSIS — Z01810 Encounter for preprocedural cardiovascular examination: Secondary | ICD-10-CM

## 2018-05-05 DIAGNOSIS — S3991XA Unspecified injury of abdomen, initial encounter: ICD-10-CM

## 2018-05-05 LAB — COMPREHENSIVE METABOLIC PANEL
Lab: 0.3 mg/dL (ref 0.3–1.2)
Lab: 0.7 mg/dL (ref 0.4–1.00)
Lab: 105 MMOL/L (ref 98–110)
Lab: 12 mg/dL (ref 7–25)
Lab: 140 MMOL/L (ref 137–147)
Lab: 19 U/L (ref 7–40)
Lab: 3.7 MMOL/L (ref 3.5–5.1)
Lab: 4.4 g/dL (ref 3.5–5.0)
Lab: 7.9 g/dL (ref 6.0–8.0)
Lab: 83 mg/dL (ref 70–100)
Lab: 86 U/L (ref 25–110)
Lab: 9.8 mg/dL (ref 8.5–10.6)

## 2018-05-05 LAB — CBC: Lab: 6.8 10*3/uL (ref 4.5–11.0)

## 2018-05-06 DIAGNOSIS — Z01810 Encounter for preprocedural cardiovascular examination: Secondary | ICD-10-CM

## 2018-05-06 DIAGNOSIS — I671 Cerebral aneurysm, nonruptured: Principal | ICD-10-CM

## 2018-05-06 DIAGNOSIS — I7771 Dissection of carotid artery: ICD-10-CM

## 2018-05-07 ENCOUNTER — Encounter: Admit: 2018-05-07 | Discharge: 2018-05-07 | Payer: 59

## 2018-05-07 DIAGNOSIS — G459 Transient cerebral ischemic attack, unspecified: ICD-10-CM

## 2018-05-07 DIAGNOSIS — I10 Essential (primary) hypertension: Principal | ICD-10-CM

## 2018-05-07 DIAGNOSIS — A64 Unspecified sexually transmitted disease: ICD-10-CM

## 2018-05-07 DIAGNOSIS — S3991XA Unspecified injury of abdomen, initial encounter: ICD-10-CM

## 2018-05-07 DIAGNOSIS — T783XXA Angioneurotic edema, initial encounter: ICD-10-CM

## 2018-06-01 ENCOUNTER — Encounter: Admit: 2018-06-01 | Discharge: 2018-06-01 | Payer: 59

## 2018-06-01 ENCOUNTER — Ambulatory Visit: Admit: 2018-06-01 | Discharge: 2018-06-01 | Payer: 59

## 2018-06-01 ENCOUNTER — Inpatient Hospital Stay: Admit: 2018-06-01 | Discharge: 2018-06-02 | Disposition: A | Discharge status: Home / Self Care | Admitting: Neurology

## 2018-06-01 DIAGNOSIS — I7771 Dissection of carotid artery: ICD-10-CM

## 2018-06-01 DIAGNOSIS — L988 Other specified disorders of the skin and subcutaneous tissue: ICD-10-CM

## 2018-06-01 DIAGNOSIS — I10 Essential (primary) hypertension: Secondary | ICD-10-CM

## 2018-06-01 DIAGNOSIS — I770 Arteriovenous fistula, acquired: Principal | ICD-10-CM

## 2018-06-01 DIAGNOSIS — S3991XA Unspecified injury of abdomen, initial encounter: ICD-10-CM

## 2018-06-01 DIAGNOSIS — A64 Unspecified sexually transmitted disease: ICD-10-CM

## 2018-06-01 DIAGNOSIS — T783XXA Angioneurotic edema, initial encounter: ICD-10-CM

## 2018-06-01 DIAGNOSIS — G459 Transient cerebral ischemic attack, unspecified: ICD-10-CM

## 2018-06-01 LAB — POC ACTIVATED CLOTTING TIME
Lab: 140 s
Lab: 265 s (ref 7–56)
Lab: 204 s
Lab: 256 s
Lab: 217 s

## 2018-06-01 MED ORDER — PHENYLEPHRINE IV DRIP (DBL CONC)
0 refills | Status: DC
Start: 2018-06-01 — End: 2018-06-01
  Administered 2018-06-01 (×2): 0.5 ug/kg/min via INTRAVENOUS

## 2018-06-01 MED ORDER — DEXTRAN 70-HYPROMELLOSE (PF) 0.1-0.3 % OP DPET
0 refills | Status: DC
Start: 2018-06-01 — End: 2018-06-01
  Administered 2018-06-01: 15:00:00 2 [drp] via OPHTHALMIC

## 2018-06-01 MED ORDER — PROMETHAZINE 25 MG/ML IJ SOLN
6.25 mg | INTRAVENOUS | 0 refills | Status: DC | PRN
Start: 2018-06-01 — End: 2018-06-01

## 2018-06-01 MED ORDER — CETIRIZINE 10 MG PO TAB
10 mg | Freq: Every evening | ORAL | 0 refills | Status: DC
Start: 2018-06-01 — End: 2018-06-02
  Administered 2018-06-02: 04:00:00 10 mg via ORAL

## 2018-06-01 MED ORDER — FENTANYL CITRATE (PF) 50 MCG/ML IJ SOLN
50 ug | INTRAVENOUS | 0 refills | Status: DC | PRN
Start: 2018-06-01 — End: 2018-06-01

## 2018-06-01 MED ORDER — ONDANSETRON HCL (PF) 4 MG/2 ML IJ SOLN
INTRAVENOUS | 0 refills | Status: DC
Start: 2018-06-01 — End: 2018-06-01
  Administered 2018-06-01: 18:00:00 4 mg via INTRAVENOUS

## 2018-06-01 MED ORDER — METOCLOPRAMIDE HCL 5 MG/ML IJ SOLN
10 mg | Freq: Once | INTRAVENOUS | 0 refills | Status: DC | PRN
Start: 2018-06-01 — End: 2018-06-01

## 2018-06-01 MED ORDER — FENTANYL CITRATE (PF) 50 MCG/ML IJ SOLN
0 refills | Status: DC
Start: 2018-06-01 — End: 2018-06-01
  Administered 2018-06-01: 15:00:00 100 ug via INTRAVENOUS

## 2018-06-01 MED ORDER — PHENYLEPHRINE IN 0.9% NACL(PF) 1 MG/10 ML (100 MCG/ML) IV SYRG
INTRAVENOUS | 0 refills | Status: DC
Start: 2018-06-01 — End: 2018-06-01
  Administered 2018-06-01 (×3): 100 ug via INTRAVENOUS

## 2018-06-01 MED ORDER — SUGAMMADEX 100 MG/ML IV SOLN
INTRAVENOUS | 0 refills | Status: DC
Start: 2018-06-01 — End: 2018-06-01
  Administered 2018-06-01: 18:00:00 200 mg via INTRAVENOUS

## 2018-06-01 MED ORDER — AMLODIPINE 10 MG PO TAB
10 mg | Freq: Every evening | ORAL | 0 refills | Status: DC
Start: 2018-06-01 — End: 2018-06-02
  Administered 2018-06-02: 04:00:00 10 mg via ORAL

## 2018-06-01 MED ORDER — DEXAMETHASONE SODIUM PHOSPHATE 10 MG/ML IJ SOLN
10 mg | INTRAVENOUS | 0 refills | Status: DC
Start: 2018-06-01 — End: 2018-06-02
  Administered 2018-06-01 – 2018-06-02 (×5): 10 mg via INTRAVENOUS

## 2018-06-01 MED ORDER — LIDOCAINE (PF) 200 MG/10 ML (2 %) IJ SYRG
0 refills | Status: DC
Start: 2018-06-01 — End: 2018-06-01
  Administered 2018-06-01: 15:00:00 100 mg via INTRAVENOUS

## 2018-06-01 MED ORDER — HYDRALAZINE 20 MG/ML IJ SOLN
10 mg | INTRAVENOUS | 0 refills | Status: DC | PRN
Start: 2018-06-01 — End: 2018-06-01

## 2018-06-01 MED ORDER — PROPOFOL INJ 10 MG/ML IV VIAL
0 refills | Status: DC
Start: 2018-06-01 — End: 2018-06-01
  Administered 2018-06-01: 15:00:00 150 mg via INTRAVENOUS

## 2018-06-01 MED ORDER — CEFAZOLIN 1 GRAM IJ SOLR
0 refills | Status: DC
Start: 2018-06-01 — End: 2018-06-01
  Administered 2018-06-01: 15:00:00 2 g via INTRAVENOUS

## 2018-06-01 MED ORDER — HYDROCORTISONE SOD SUCC (PF) 100 MG/2 ML IJ SOLR
100 mg | Freq: Once | INTRAVENOUS | 0 refills | Status: CP
Start: 2018-06-01 — End: ?
  Administered 2018-06-01: 14:00:00 100 mg via INTRAVENOUS

## 2018-06-01 MED ORDER — HALOPERIDOL LACTATE 5 MG/ML IJ SOLN
1 mg | Freq: Once | INTRAVENOUS | 0 refills | Status: DC | PRN
Start: 2018-06-01 — End: 2018-06-01

## 2018-06-01 MED ORDER — OXYCODONE 5 MG PO TAB
5-10 mg | Freq: Once | ORAL | 0 refills | Status: DC | PRN
Start: 2018-06-01 — End: 2018-06-01

## 2018-06-01 MED ORDER — HYDRALAZINE 20 MG/ML IJ SOLN
10 mg | INTRAVENOUS | 0 refills | Status: DC | PRN
Start: 2018-06-01 — End: 2018-06-02
  Administered 2018-06-02: 10 mg via INTRAVENOUS

## 2018-06-01 MED ORDER — MEPERIDINE (PF) 25 MG/ML IJ SYRG
12.5 mg | INTRAVENOUS | 0 refills | Status: DC | PRN
Start: 2018-06-01 — End: 2018-06-01

## 2018-06-01 MED ORDER — PROTAMINE 10 MG/ML IV SOLN
0 refills | Status: DC
Start: 2018-06-01 — End: 2018-06-01
  Administered 2018-06-01: 18:00:00 20 mg via INTRAVENOUS

## 2018-06-01 MED ORDER — HEPARIN, PORCINE (PF) 1 UNIT/ML IV SYRG
0 refills | Status: DC
Start: 2018-06-01 — End: 2018-06-01

## 2018-06-01 MED ORDER — LABETALOL 5 MG/ML IV SYRG
10-20 mg | INTRAVENOUS | 0 refills | Status: DC | PRN
Start: 2018-06-01 — End: 2018-06-01

## 2018-06-01 MED ORDER — ACETAMINOPHEN 325 MG PO TAB
650 mg | ORAL | 0 refills | Status: DC | PRN
Start: 2018-06-01 — End: 2018-06-02

## 2018-06-01 MED ORDER — SENNOSIDES-DOCUSATE SODIUM 8.6-50 MG PO TAB
1 | Freq: Two times a day (BID) | ORAL | 0 refills | Status: DC
Start: 2018-06-01 — End: 2018-06-02
  Administered 2018-06-02 (×2): 1 via ORAL

## 2018-06-01 MED ORDER — DEXAMETHASONE SODIUM PHOSPHATE 4 MG/ML IJ SOLN
INTRAVENOUS | 0 refills | Status: DC
Start: 2018-06-01 — End: 2018-06-01
  Administered 2018-06-01: 16:00:00 6 mg via INTRAVENOUS
  Administered 2018-06-01: 16:00:00 4 mg via INTRAVENOUS

## 2018-06-01 MED ORDER — ELECTROLYTE-A IV SOLP
0 refills | Status: DC
Start: 2018-06-01 — End: 2018-06-01
  Administered 2018-06-01: 15:00:00 via INTRAVENOUS

## 2018-06-01 MED ORDER — ESMOLOL 100 MG/10 ML (10 MG/ML) IV SOLN
0 refills | Status: DC
Start: 2018-06-01 — End: 2018-06-01
  Administered 2018-06-01: 18:00:00 20 mg via INTRAVENOUS

## 2018-06-01 MED ORDER — PATCH DOCUMENTATION - SCOPOLAMINE BASE 1 MG/72HR
1 | Freq: Two times a day (BID) | TRANSDERMAL | 0 refills | Status: DC
Start: 2018-06-01 — End: 2018-06-02

## 2018-06-01 MED ORDER — MIDAZOLAM 1 MG/ML IJ SOLN
INTRAVENOUS | 0 refills | Status: DC
Start: 2018-06-01 — End: 2018-06-01
  Administered 2018-06-01: 15:00:00 2 mg via INTRAVENOUS

## 2018-06-01 MED ORDER — DOCUSATE SODIUM 100 MG PO CAP
100 mg | Freq: Two times a day (BID) | ORAL | 0 refills | Status: DC
Start: 2018-06-01 — End: 2018-06-02
  Administered 2018-06-02 (×2): 100 mg via ORAL

## 2018-06-01 MED ORDER — SODIUM CHLORIDE 0.9 % IV SOLP
0 refills | Status: DC
Start: 2018-06-01 — End: 2018-06-01
  Administered 2018-06-01: 15:00:00 via INTRAVENOUS

## 2018-06-01 MED ORDER — SCOPOLAMINE BASE 1 MG OVER 3 DAYS TD PT3D
1 | TRANSDERMAL | 0 refills | Status: DC
Start: 2018-06-01 — End: 2018-06-02
  Administered 2018-06-01: 15:00:00 1 via TRANSDERMAL

## 2018-06-01 MED ORDER — DIPHENHYDRAMINE HCL 50 MG/ML IJ SOLN
50 mg | Freq: Once | INTRAVENOUS | 0 refills | Status: CP
Start: 2018-06-01 — End: ?
  Administered 2018-06-01: 14:00:00 50 mg via INTRAVENOUS

## 2018-06-01 MED ORDER — LABETALOL 5 MG/ML IV SYRG
10-20 mg | INTRAVENOUS | 0 refills | Status: DC | PRN
Start: 2018-06-01 — End: 2018-06-02

## 2018-06-01 MED ORDER — ROCURONIUM 10 MG/ML IV SOLN
INTRAVENOUS | 0 refills | Status: DC
Start: 2018-06-01 — End: 2018-06-01
  Administered 2018-06-01: 15:00:00 50 mg via INTRAVENOUS
  Administered 2018-06-01: 16:00:00 20 mg via INTRAVENOUS

## 2018-06-01 MED ORDER — HEPARIN (PORCINE) 1,000 UNIT/ML IJ SOLN
0 refills | Status: DC
Start: 2018-06-01 — End: 2018-06-01
  Administered 2018-06-01: 16:00:00 4500 [IU] via INTRAVENOUS
  Administered 2018-06-01: 17:00:00 1000 [IU] via INTRAVENOUS

## 2018-06-01 MED ORDER — ASPIRIN 81 MG PO TBEC
81 mg | Freq: Every evening | ORAL | 0 refills | Status: DC
Start: 2018-06-01 — End: 2018-06-02

## 2018-06-01 MED ORDER — LABETALOL 5 MG/ML IV SYRG
0 refills | Status: DC
Start: 2018-06-01 — End: 2018-06-01
  Administered 2018-06-01: 18:00:00 5 mg via INTRAVENOUS

## 2018-06-01 MED ORDER — IOPAMIDOL 61 % IV SOLN
150 mL | Freq: Once | INTRA_ARTERIAL | 0 refills | Status: CP
Start: 2018-06-01 — End: ?
  Administered 2018-06-01: 18:00:00 150 mL via INTRA_ARTERIAL

## 2018-06-01 MED ORDER — MAGNESIUM HYDROXIDE 2,400 MG/10 ML PO SUSP
10 mL | Freq: Every day | ORAL | 0 refills | Status: DC
Start: 2018-06-01 — End: 2018-06-02

## 2018-06-01 MED ORDER — VALACYCLOVIR 500 MG PO TAB
500 mg | Freq: Every evening | ORAL | 0 refills | Status: DC
Start: 2018-06-01 — End: 2018-06-02
  Administered 2018-06-02: 04:00:00 500 mg via ORAL

## 2018-06-02 ENCOUNTER — Inpatient Hospital Stay: Admit: 2018-05-05 | Discharge: 2018-05-05 | Payer: 59

## 2018-06-02 ENCOUNTER — Encounter: Admit: 2018-06-02 | Discharge: 2018-06-02 | Payer: 59

## 2018-06-02 ENCOUNTER — Inpatient Hospital Stay: Admit: 2018-06-01 | Discharge: 2018-06-01 | Payer: 59

## 2018-06-02 ENCOUNTER — Ambulatory Visit: Admit: 2018-06-01 | Discharge: 2018-06-01 | Payer: 59

## 2018-06-02 DIAGNOSIS — H0520 Unspecified exophthalmos: ICD-10-CM

## 2018-06-02 DIAGNOSIS — I770 Arteriovenous fistula, acquired: Principal | ICD-10-CM

## 2018-06-02 DIAGNOSIS — Z8673 Personal history of transient ischemic attack (TIA), and cerebral infarction without residual deficits: ICD-10-CM

## 2018-06-02 DIAGNOSIS — I10 Essential (primary) hypertension: ICD-10-CM

## 2018-06-02 DIAGNOSIS — R51 Headache: ICD-10-CM

## 2018-06-02 DIAGNOSIS — H532 Diplopia: ICD-10-CM

## 2018-06-02 DIAGNOSIS — H4921 Sixth [abducent] nerve palsy, right eye: ICD-10-CM

## 2018-06-02 LAB — PHOSPHORUS: Lab: 2 mg/dL — ABNORMAL LOW (ref >60)

## 2018-06-02 LAB — BASIC METABOLIC PANEL: Lab: 138 MMOL/L — ABNORMAL LOW (ref 137–147)

## 2018-06-02 LAB — IONIZED CALCIUM: Lab: 1.1 MMOL/L — ABNORMAL LOW (ref 1.0–1.3)

## 2018-06-02 LAB — MAGNESIUM: Lab: 2 mg/dL — ABNORMAL LOW (ref 1.6–2.6)

## 2018-06-02 LAB — CBC AND DIFF: Lab: 9.9 K/UL — ABNORMAL HIGH (ref >60)

## 2018-06-02 MED ORDER — POTASSIUM CHLORIDE 20 MEQ PO TBTQ
40 meq | Freq: Once | ORAL | 0 refills | Status: CP
Start: 2018-06-02 — End: ?
  Administered 2018-06-02: 12:00:00 40 meq via ORAL

## 2018-06-02 MED ORDER — METHYLPREDNISOLONE 4 MG PO DSPK
ORAL_TABLET | 0 refills | Status: AC
Start: 2018-06-02 — End: 2018-07-24
  Filled 2018-06-02 (×2): qty 21, 6d supply, fill #1

## 2018-07-06 ENCOUNTER — Encounter: Admit: 2018-07-06 | Discharge: 2018-07-06 | Payer: 59

## 2018-07-06 ENCOUNTER — Ambulatory Visit: Admit: 2018-07-06 | Discharge: 2018-07-07

## 2018-07-06 DIAGNOSIS — I10 Essential (primary) hypertension: Secondary | ICD-10-CM

## 2018-07-06 DIAGNOSIS — A64 Unspecified sexually transmitted disease: ICD-10-CM

## 2018-07-06 DIAGNOSIS — T783XXA Angioneurotic edema, initial encounter: ICD-10-CM

## 2018-07-06 DIAGNOSIS — G459 Transient cerebral ischemic attack, unspecified: ICD-10-CM

## 2018-07-06 DIAGNOSIS — S3991XA Unspecified injury of abdomen, initial encounter: ICD-10-CM

## 2018-07-06 DIAGNOSIS — S02602D Fracture of unspecified part of body of left mandible, subsequent encounter for fracture with routine healing: Principal | ICD-10-CM

## 2018-07-24 ENCOUNTER — Encounter: Admit: 2018-07-24 | Discharge: 2018-07-24 | Payer: 59

## 2018-07-24 DIAGNOSIS — I10 Essential (primary) hypertension: Secondary | ICD-10-CM

## 2018-07-24 DIAGNOSIS — G459 Transient cerebral ischemic attack, unspecified: Secondary | ICD-10-CM

## 2018-07-24 DIAGNOSIS — T783XXA Angioneurotic edema, initial encounter: Secondary | ICD-10-CM

## 2018-07-24 DIAGNOSIS — A64 Unspecified sexually transmitted disease: Secondary | ICD-10-CM

## 2018-07-24 DIAGNOSIS — S3991XA Unspecified injury of abdomen, initial encounter: Secondary | ICD-10-CM

## 2018-07-25 ENCOUNTER — Ambulatory Visit: Admit: 2018-07-24 | Discharge: 2018-07-25

## 2018-07-25 DIAGNOSIS — I671 Cerebral aneurysm, nonruptured: Secondary | ICD-10-CM

## 2018-08-09 ENCOUNTER — Ambulatory Visit: Admit: 2018-08-09 | Discharge: 2018-08-10

## 2018-08-09 ENCOUNTER — Encounter: Admit: 2018-08-09 | Discharge: 2018-08-09 | Payer: 59

## 2018-08-09 DIAGNOSIS — I10 Essential (primary) hypertension: Secondary | ICD-10-CM

## 2018-08-09 DIAGNOSIS — T783XXA Angioneurotic edema, initial encounter: Secondary | ICD-10-CM

## 2018-08-09 DIAGNOSIS — A64 Unspecified sexually transmitted disease: Secondary | ICD-10-CM

## 2018-08-09 DIAGNOSIS — S3991XA Unspecified injury of abdomen, initial encounter: Secondary | ICD-10-CM

## 2018-08-09 DIAGNOSIS — G459 Transient cerebral ischemic attack, unspecified: Secondary | ICD-10-CM

## 2018-08-09 DIAGNOSIS — I671 Cerebral aneurysm, nonruptured: Secondary | ICD-10-CM

## 2018-09-18 IMAGING — CT Spine^1_C_SPINE (Adult)
1 series · 12 of 14 positions shown, 15 images · non-contrast
Comparison: none

[Series 1: c-spine 1.5 soft tissue · axial · 0.29mm/px · z∈[+137,+260]mm · 12 of 98 slices shown, 15 images]
[im 8/98  soft-tissue]
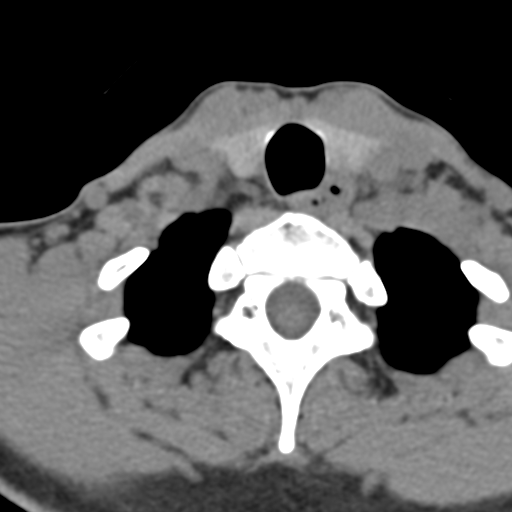
[im 8/98  bone]
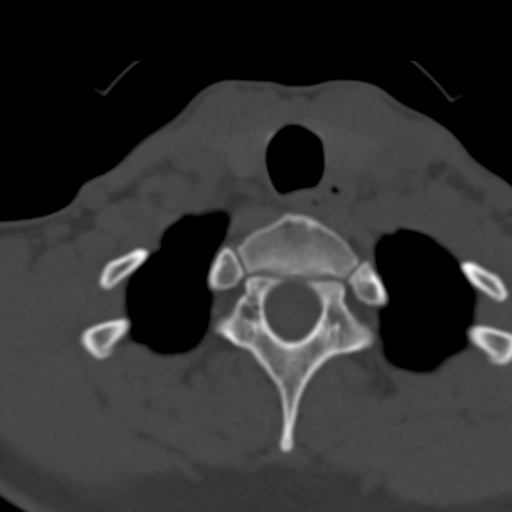
[im 15/98  bone]
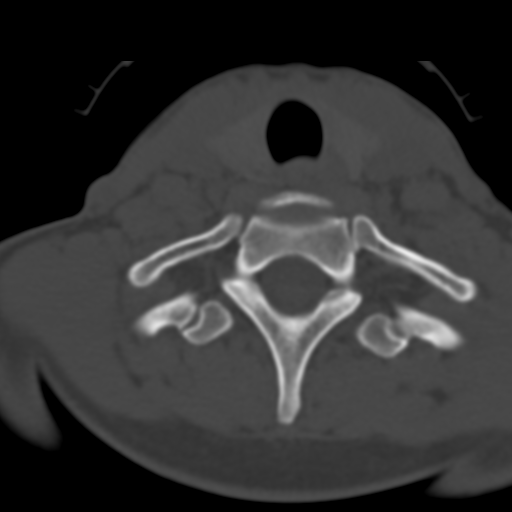
[im 23/98  bone]
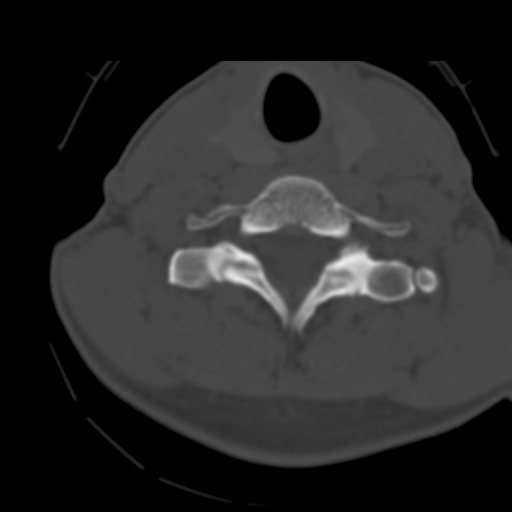
[im 30/98  bone]
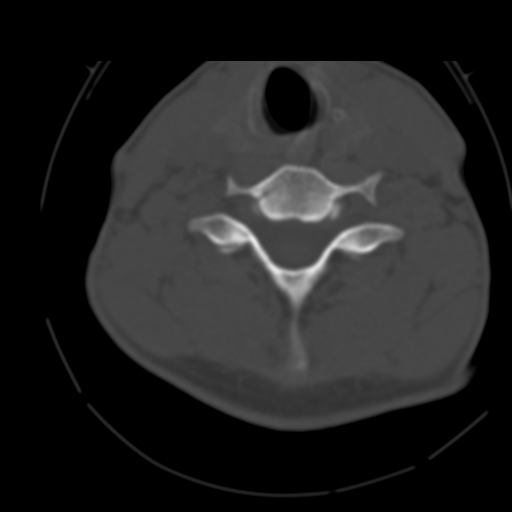
[im 38/98  soft-tissue]
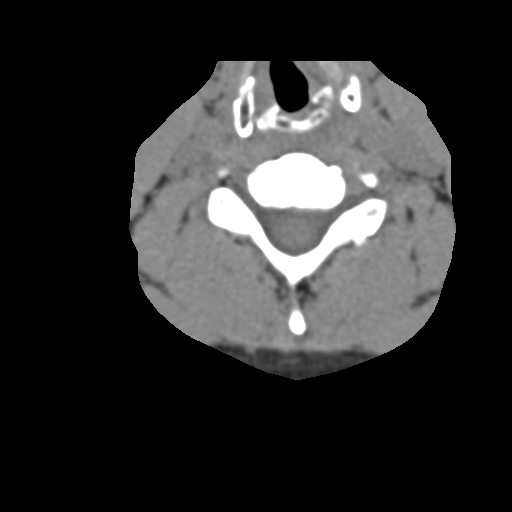
[im 38/98  bone]
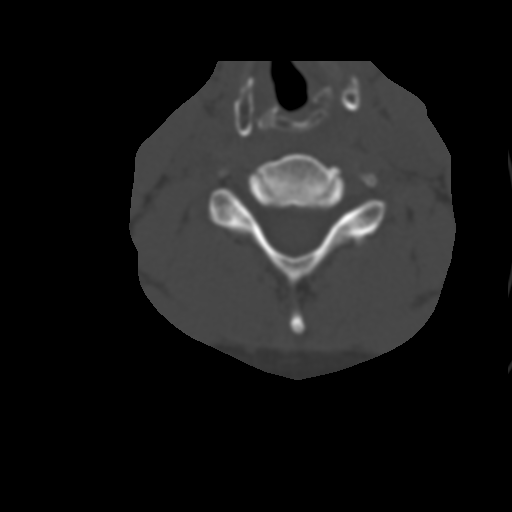
[im 45/98  bone]
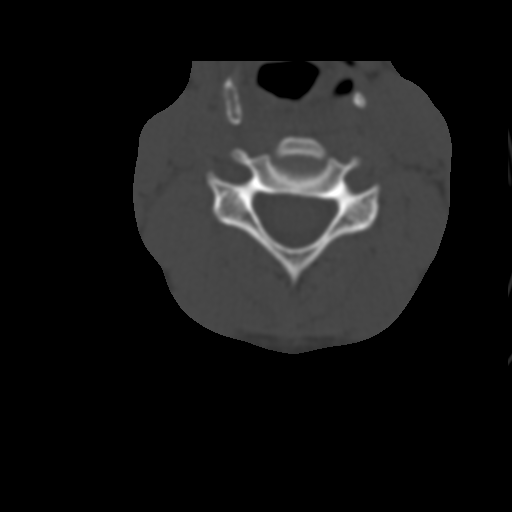
[im 53/98  bone]
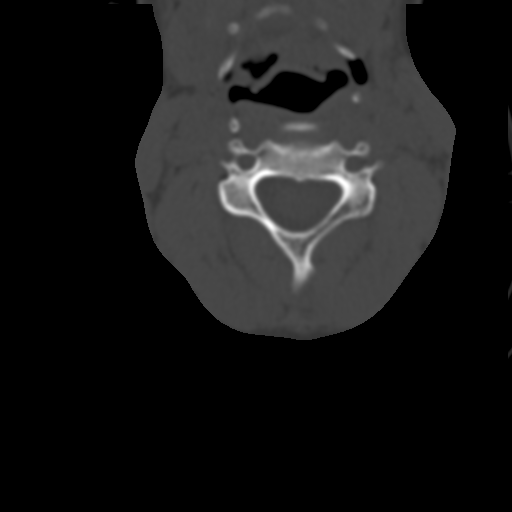
[im 60/98  bone]
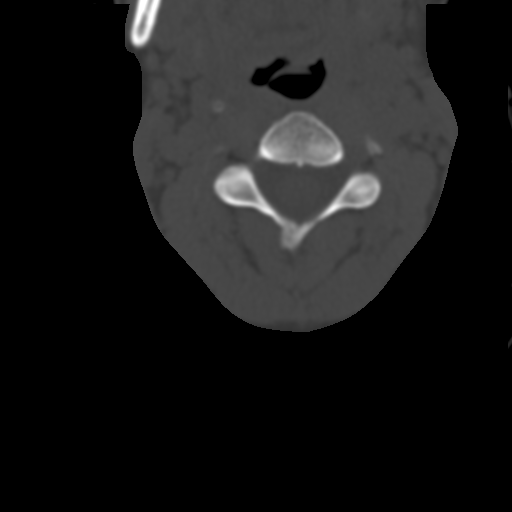
[im 68/98  soft-tissue]
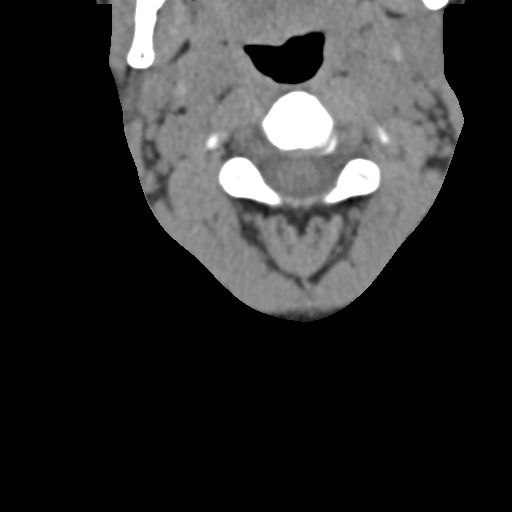
[im 68/98  bone]
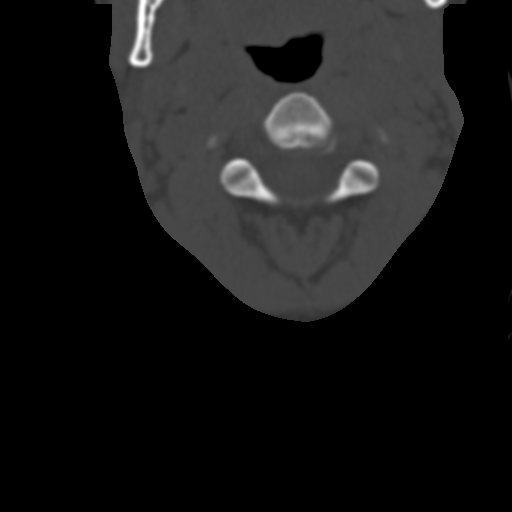
[im 75/98  bone]
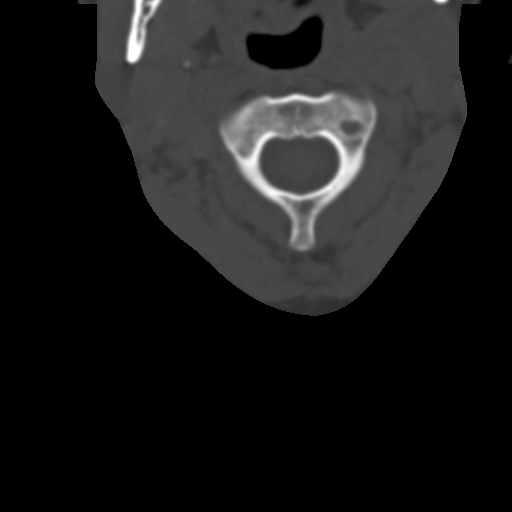
[im 83/98  bone]
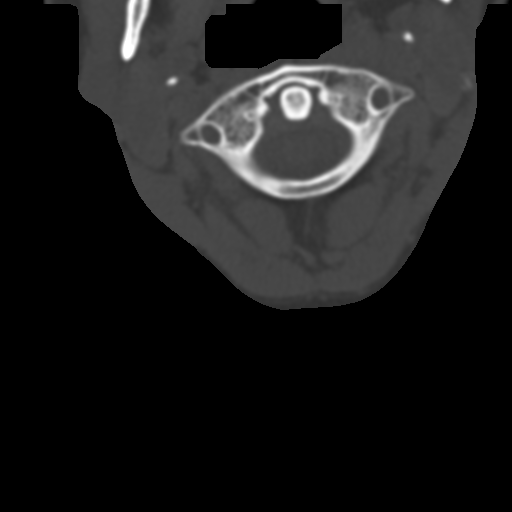
[im 90/98  bone]
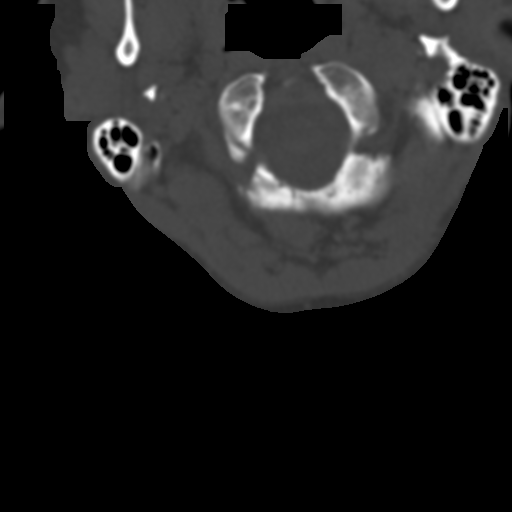

[12 of 14 positions shown; findings below may reference images not displayed]

EXAM
CT cervical spine without contrast.

INDICATION
Fall from loft. Neck pain. Bld thinner
10 ft fall from barn loft, neck pain. On blood thinners - recent carotid artery dissection and jaw
fx x6 weeks - denies preg - [place]

FINDINGS
Prior study was reviewed from 09/16/2017.
There is no fracture or alignment abnormality of the cervical vertebrae.
The spinal canal diameter is normal.

IMPRESSION
There is no significant CT abnormality of the cervical spine.

Tech Notes:

10 ft fall from barn loft, neck pain. On blood thinners - recent carotid artery dissection and jaw
fx x6 weeks - denies preg - [place]

## 2018-09-18 IMAGING — CT Head^1_TRAUMA_HEAD_C_SPINE (Adult)
1 series · 12 of 14 positions shown, 15 images · non-contrast
Comparison: none

[Series 2: brain w/o 4.8 brain · axial · non-contrast · 0.55mm/px · z∈[+323,+446]mm · 12 of 30 slices shown, 15 images]
[im 3/30  soft-tissue]
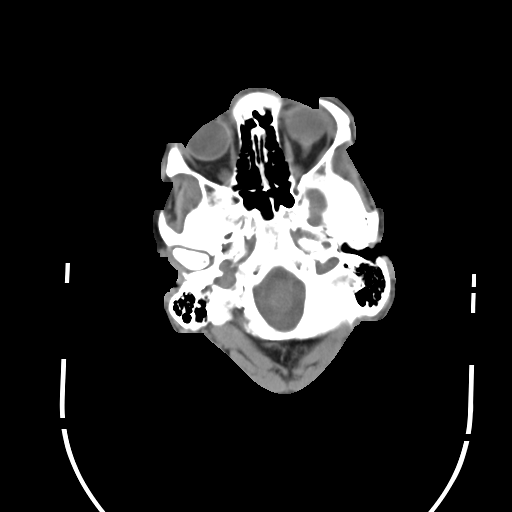
[im 3/30  bone]
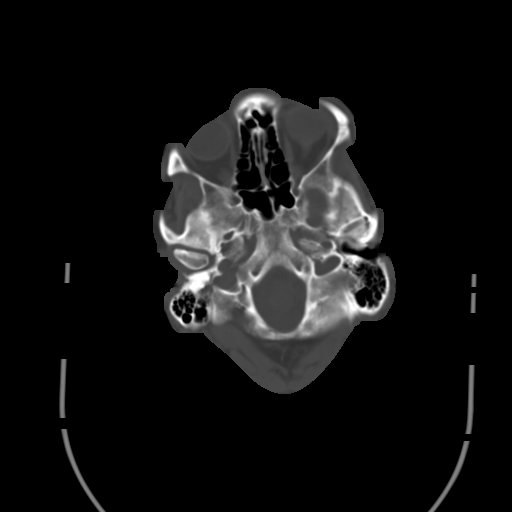
[im 5/30  bone]
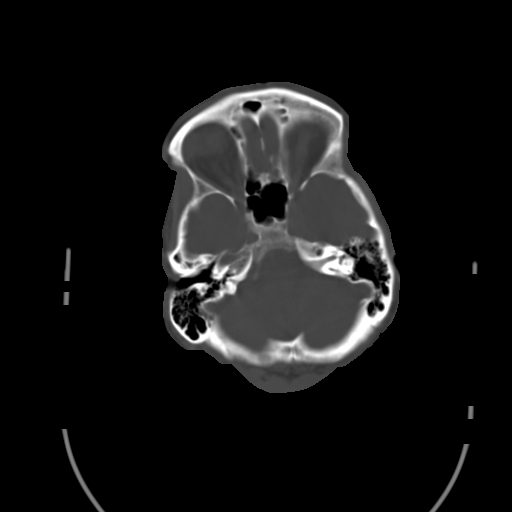
[im 7/30  bone]
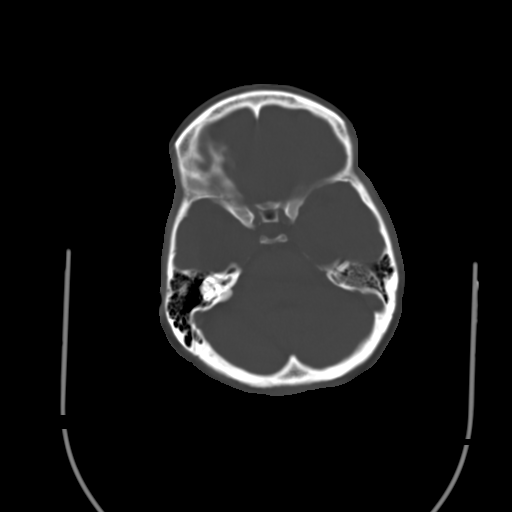
[im 9/30  bone]
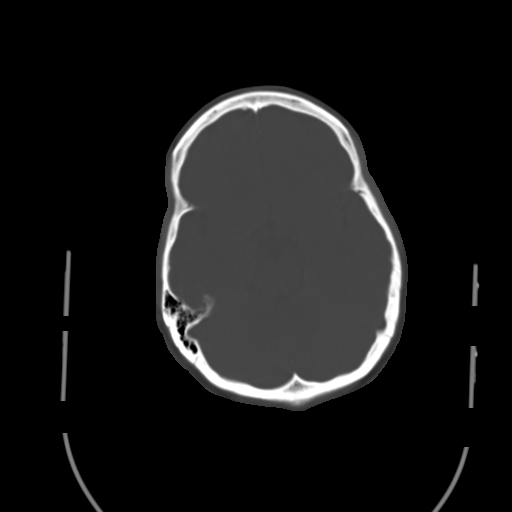
[im 12/30  soft-tissue]
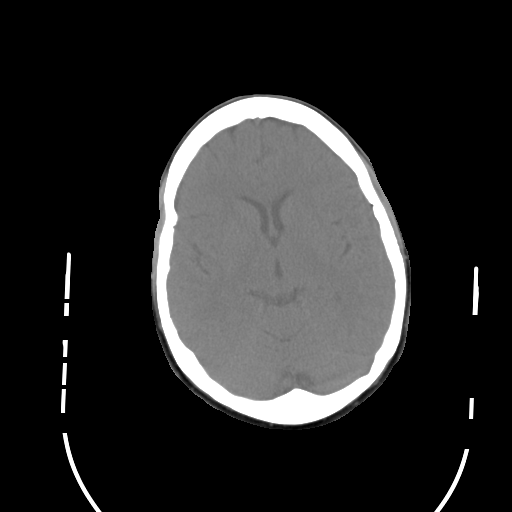
[im 12/30  bone]
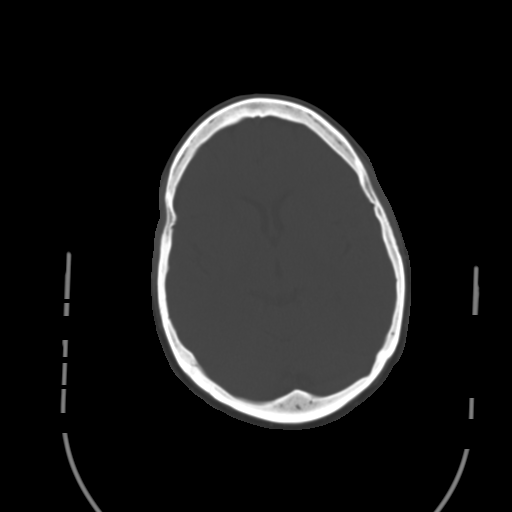
[im 14/30  bone]
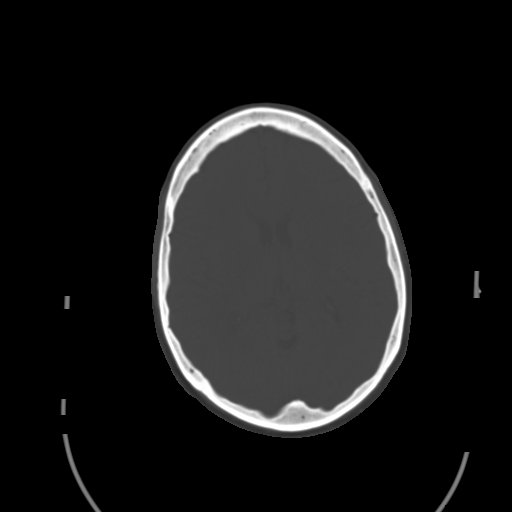
[im 16/30  bone]
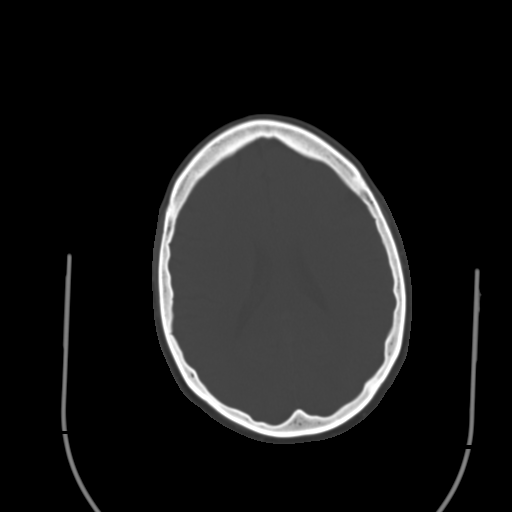
[im 18/30  bone]
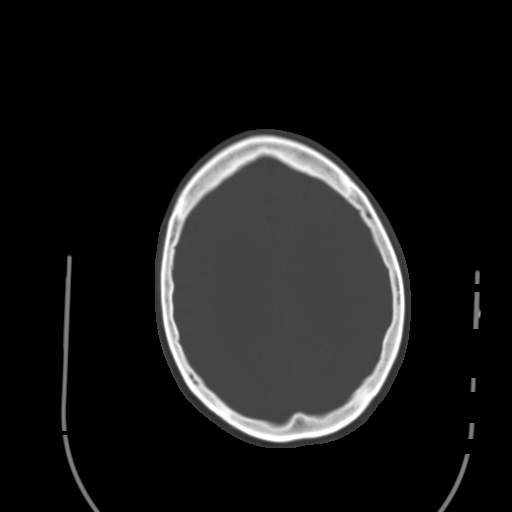
[im 21/30  soft-tissue]
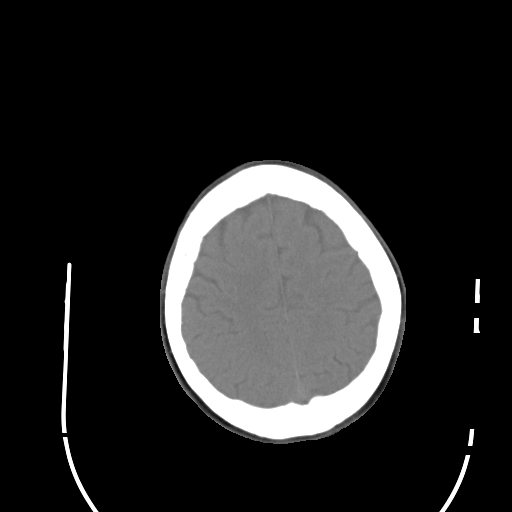
[im 21/30  bone]
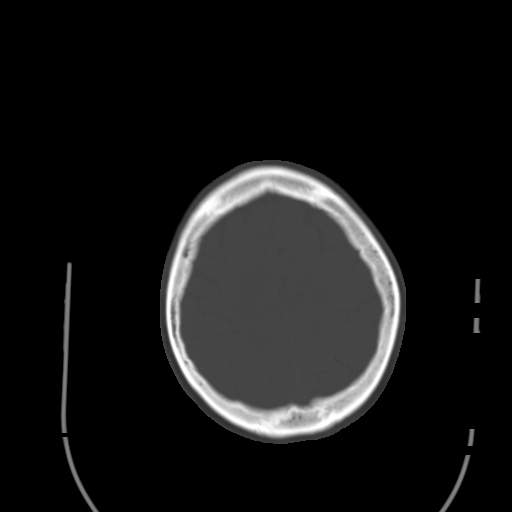
[im 23/30  bone]
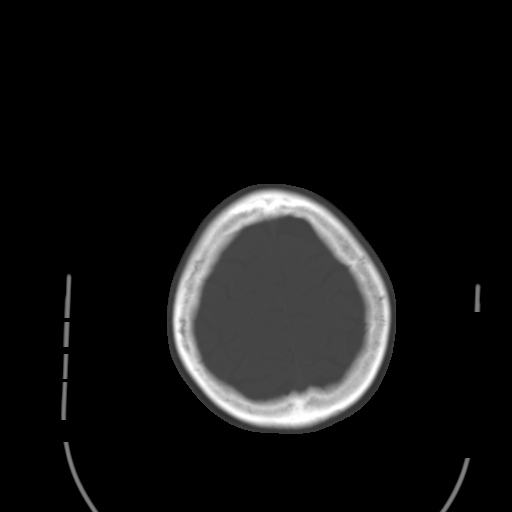
[im 25/30  bone]
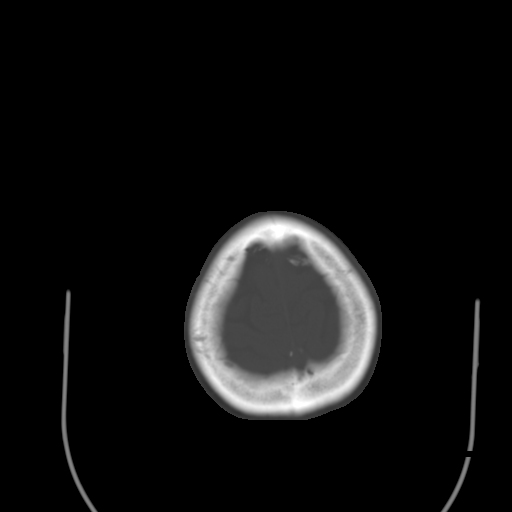
[im 27/30  bone]
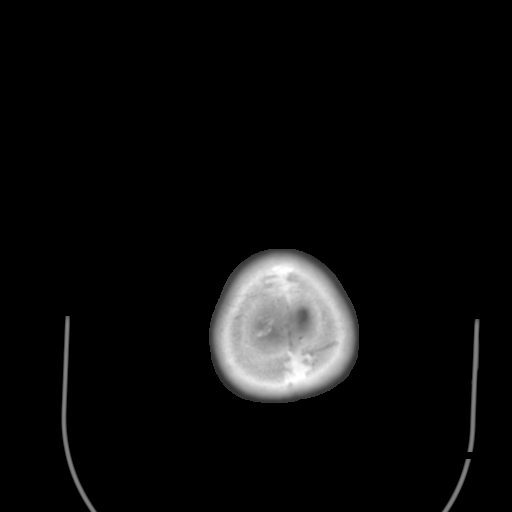

[12 of 14 positions shown; findings below may reference images not displayed]

EXAM
Head CT without contrast.

INDICATION
10 ft fall from barn loft, neck pain. On [place] Luersen
10 ft fall from barn loft, neck pain. On blood thinners - recent carotid artery dissection and jaw
fx x6 weeks - denies preg - [place]

FINDINGS
The prior study was reviewed from 09/16/2017.
There is no acute intracranial hemorrhage. There is no mass lesion or midline shift. There are
small areas of hypodensity in the white matter of the frontal lobes may be secondary to motion artif
act.
There is no hydrocephalus. There is no skull fracture.

IMPRESSION
There is no acute appearing CT abnormality of the head.

Tech Notes:

10 ft fall from barn loft, neck pain. On blood thinners - recent carotid artery dissection and jaw
fx x6 weeks - denies preg - [place]

## 2018-12-12 ENCOUNTER — Encounter: Admit: 2018-12-12 | Discharge: 2018-12-12 | Payer: 59

## 2018-12-12 DIAGNOSIS — Z1159 Encounter for screening for other viral diseases: Principal | ICD-10-CM

## 2018-12-12 MED ORDER — PREDNISONE 50 MG PO TAB
50 mg | ORAL_TABLET | ORAL | 0 refills | Status: AC
Start: 2018-12-12 — End: ?

## 2018-12-12 NOTE — Progress Notes
Due to upcoming scheduled procedure, the patient has been scheduled for COVID-19 Swab testing to be completed within 48 hours of scheduled procedure.     Appointment Information:  - Date: 6/24  - Time: 2:30 PM  - Location: Main Campus    Patient Instructions:  When you arrive to the clinic for your appointment, pull up to the cones, call the number and someone will come out to collect the swab. You will not need to get out of your car for the appointment. You will receive details, frequently asked questions and answers, and a map to the swab clinic location in a MyChart message or by email.    It is extremely important that you quarantine between getting tested and receiving the results. Please stay at home, wash your hands frequently, avoid touching your face, clean high-touch surfaces and maintain at least 6 feet of distance between yourself and others.    We will contact you when your test results are available. If your result is positive, we will call you. You will not be able to have surgery, and we'll help you obtain care for COVID-19. If your result is negative, your surgery will proceed, and we will notify you of scheduling details and next steps in MyChart.".     Patient verbalized understanding. No further questions at this time.    [Name, Title]

## 2018-12-12 NOTE — Patient Education
Dear Ms. Galanti,      Thank you for choosing [facility] Interventional Radiology for your procedure. Your appointment information is listed below:     You are scheduled for a Cerebral Angiogram with Dr. Darin Engels on 6/26 at 10:00 AM.   Please check into admission of the [facility] at 9:00 AM and bring a driver.  ??? [employer]: [address], [place], [place]  [address]  Parking: [address] [address] [facility]age        INTERVENTIONAL RADIOLOGY  PRE-PROCEDURE INSTRUCTIONS SEDATION     You are scheduled for a procedure in Interventional Radiology with procedural sedation.  Please follow these instructions and any direction from your Primary Care/Managing Physician.  If you have questions about your procedure or need to reschedule please call [phone].     Medication Instructions:   You may take your medications with a sip of water. Please take Prednisone 13 hours, 7 hours, and 1 hour prior to procedure.      Diet Instructions:  a. At 2 AM on 6/26, (8) hours before your procedure, stop your regular diet and start a clear liquid diet.  b. At 4 AM on 6/26, (6) hours before your procedure, discontinue tube feedings and chewing tobacco.  c. At 8 AM on 6/26, (2) hours before your procedure discontinue clear liquids.  You should have nothing by mouth. This includes GUM or CANDY.      Clear Liquid Diet     Water                 Apple or White Grape Juice        Coffee or tea without cream   Tea                    White Cranberry Juice                 Chicken Bouillon or Broth (no noodles)  Soda Pop           Popsicles                                     Beef Bouillon or Broth (no noodles)     Day of Exam Instructions:  1. Bathe or shower with an antibacterial soap prior to your appointment.  2. If you have a history of Obstructive Sleep Apnea (OSA) bring your CPAP/BIPAP.   3. Bring a list of your current medications and the dosages.  4. Wear comfortable clothing and leave valuables at home. 5. Arrive (1) hour prior to your appointment.  This time will be spent registering, interviewing, assessing, educating and preparing you for the test.  ??? You will be with [place] anywhere from 30 minutes to 6 hours after your exam depending on your procedure.  6. You may be sedated for the procedure. A responsible adult must drive you home (no Benedetto Goad, taxis or buses are allowed) and stay with you overnight. If you do not have a driver we will be unable to perform your procedure.   7. You will not be able to return to work or drive the same day if receiving sedation.

## 2019-01-10 ENCOUNTER — Ambulatory Visit: Admit: 2019-01-10 | Discharge: 2019-01-11

## 2019-01-10 DIAGNOSIS — Z01818 Encounter for other preprocedural examination: Secondary | ICD-10-CM

## 2019-01-10 NOTE — Progress Notes
Patient arrived to [place] [facility] clinic for COVID-19 testing 01/10/19 1422. Patient identity confirmed via photo I.D. Nasopharyngeal procedure explained to the patient.   Nasopharyngeal swab completed left  Patient education provided given and instructed patient self isolate until contacted w/ results and further instructions.   Swab collected by Corky Downs.    Date symptoms began/reason for testing: preop clearance

## 2019-01-11 DIAGNOSIS — Z1159 Encounter for screening for other viral diseases: Secondary | ICD-10-CM

## 2019-01-12 ENCOUNTER — Ambulatory Visit: Admit: 2019-01-12 | Discharge: 2019-01-12

## 2019-01-12 ENCOUNTER — Encounter: Admit: 2019-01-12 | Discharge: 2019-01-12

## 2019-01-12 DIAGNOSIS — S0181XA Laceration without foreign body of other part of head, initial encounter: Secondary | ICD-10-CM

## 2019-01-12 DIAGNOSIS — Z8673 Personal history of transient ischemic attack (TIA), and cerebral infarction without residual deficits: Secondary | ICD-10-CM

## 2019-01-12 DIAGNOSIS — Z09 Encounter for follow-up examination after completed treatment for conditions other than malignant neoplasm: Secondary | ICD-10-CM

## 2019-01-12 DIAGNOSIS — Z888 Allergy status to other drugs, medicaments and biological substances status: Secondary | ICD-10-CM

## 2019-01-12 DIAGNOSIS — I10 Essential (primary) hypertension: Principal | ICD-10-CM

## 2019-01-12 DIAGNOSIS — G459 Transient cerebral ischemic attack, unspecified: Secondary | ICD-10-CM

## 2019-01-12 DIAGNOSIS — G8911 Acute pain due to trauma: Secondary | ICD-10-CM

## 2019-01-12 DIAGNOSIS — I671 Cerebral aneurysm, nonruptured: Secondary | ICD-10-CM

## 2019-01-12 DIAGNOSIS — Z7982 Long term (current) use of aspirin: Secondary | ICD-10-CM

## 2019-01-12 DIAGNOSIS — S3991XA Unspecified injury of abdomen, initial encounter: Secondary | ICD-10-CM

## 2019-01-12 DIAGNOSIS — Z79899 Other long term (current) drug therapy: Secondary | ICD-10-CM

## 2019-01-12 DIAGNOSIS — T783XXA Angioneurotic edema, initial encounter: Secondary | ICD-10-CM

## 2019-01-12 DIAGNOSIS — Z91040 Latex allergy status: Secondary | ICD-10-CM

## 2019-01-12 DIAGNOSIS — A64 Unspecified sexually transmitted disease: Secondary | ICD-10-CM

## 2019-01-12 DIAGNOSIS — Z885 Allergy status to narcotic agent status: Secondary | ICD-10-CM

## 2019-01-12 LAB — COVID-19 (SARS-COV-2) PCR

## 2019-01-12 LAB — PREGNANCY TEST-URINE
Lab: 1
Lab: NEGATIVE

## 2019-01-12 MED ORDER — NALOXONE 0.4 MG/ML IJ SOLN
.08 mg | INTRAVENOUS | 0 refills | Status: DC | PRN
Start: 2019-01-12 — End: 2019-01-12

## 2019-01-12 MED ORDER — FLUMAZENIL 0.1 MG/ML IV SOLN
.2 mg | INTRAVENOUS | 0 refills | Status: DC | PRN
Start: 2019-01-12 — End: 2019-01-12

## 2019-01-12 MED ORDER — MIDAZOLAM 1 MG/ML IJ SOLN
1 mg | Freq: Once | INTRAVENOUS | 0 refills | Status: CP
Start: 2019-01-12 — End: ?
  Administered 2019-01-12: 15:00:00 1 mg via INTRAVENOUS

## 2019-01-12 MED ORDER — SODIUM CHLORIDE 0.9 % IV SOLP
0 refills | Status: CP
Start: 2019-01-12 — End: ?
  Administered 2019-01-12: 16:00:00 300 mL via INTRAVENOUS

## 2019-01-12 MED ORDER — MIDAZOLAM 1 MG/ML IJ SOLN
0 refills | Status: CP
Start: 2019-01-12 — End: ?
  Administered 2019-01-12: 15:00:00 1 mg via INTRAVENOUS

## 2019-01-12 MED ORDER — SODIUM CHLORIDE 0.9 % IV SOLP
1000 mL | Freq: Once | INTRAVENOUS | 0 refills | Status: DC
Start: 2019-01-12 — End: 2019-01-12

## 2019-01-12 MED ORDER — DIPHENHYDRAMINE HCL 50 MG/ML IJ SOLN
50 mg | Freq: Once | INTRAVENOUS | 0 refills | Status: CP
Start: 2019-01-12 — End: ?
  Administered 2019-01-12: 15:00:00 50 mg via INTRAVENOUS

## 2019-01-12 MED ORDER — IOHEXOL 300 MG IODINE/ML IV SOLN
80 mL | Freq: Once | INTRA_ARTERIAL | 0 refills | Status: CP
Start: 2019-01-12 — End: ?
  Administered 2019-01-12: 16:00:00 80 mL via INTRA_ARTERIAL

## 2019-01-12 MED ORDER — FENTANYL CITRATE (PF) 50 MCG/ML IJ SOLN
0 refills | Status: CP
Start: 2019-01-12 — End: ?
  Administered 2019-01-12 (×4): 50 ug via INTRAVENOUS

## 2019-01-12 NOTE — Progress Notes
Sedation physician present in room.  Recent vitals and patient condition reviewed between sedating physician and nurse.  Reassessment completed.  Determination made to proceed with planned sedation.

## 2019-01-12 NOTE — Patient Instructions
INTERVENTIONAL RADIOLOGY DISCHARGE INSTRUCTIONS  ARTERIOGRAM    An arteriogram or angiogram is a procedure in which a tiny catheter is inserted into an artery and fluoroscopy (x-rays) and contrast dye are used by the Interventional Radiologist to diagnose or treat certain conditions.??? Some examples of conditions that involve arteriography include narrowed or blocked arteries, arterial malformations, and arterial bleeding that may occur from trauma.  Arteriography can be used in many different parts of the body and may also be used during treatment of certain malignancies.???????????????????????????????????????????????????????????????????????????  This procedure is being done for you for the following reason: __________________________________.???  POST-PROCEDURE ACTIVITY:  ??? A responsible adult must drive you home after the procedure.  ??? If you receive sedation or anesthesia, do not drive, operate heavy machinery or do anything that requires concentration for at least 24 hours.???  ??? It is recommended that a responsible adult be with you until morning.???  ??? Avoid any exertion for one week.??? Exertion is lifting over 10 lbs., pushing, pulling or straining.???  ??? Avoid excessive bending, stooping, or stair climbing for 2 days.??? It is okay to go up stairs or bend over but take it slowly and keep it to a minimum.???  ??? You may be up and about while relaxing at home as you recover.  POST-PROCEDURE SITE CARE:  ??? You will have a bandage over the site.??? Keep this dry.??? You may remove it in 24 hours.  ??? You may shower in 24 hours after removing the bandage.??? Wash and dry the site gently.  ??? Do not submerge the site underwater for one week (no tub bath, swimming, hot tub, etc.)  ??? Do not use ointments, creams or powders on the puncture site.  ??? Be sure your hands are clean when touching near the site.  ??? Inspect the site daily.  DIET/MEDICATIONS:  ??? You may resume your previous diet after the procedure.  ??? If you receive sedation or narcotic pain medications, avoid any foods or beverages containing alcohol for at least 24 hours.  ??? Please see the Medication Reconciliation sheet for instructions regarding resuming your home medications.  WHEN TO CALL THE DOCTOR:????????????????????????  ??? If you have significant bleeding (more than a teaspoon) or swelling at the site (bigger than a golf ball), lie down, apply firm pressure to the site and call 911. Bleeding from a large vessel requires professional help.  ??? If you have signs of infection such as: Chills, body aches, fever greater than 101F, redness, swelling or warmth at the puncture site.???  ??? If you have???severe pain unrelieved by medication.??? It is common to have mild soreness or slight swelling at the puncture site for 1-2 weeks.  ??? If you have numbness, tingling, or weakness in the leg below the puncture site or your leg becomes cold and pale.  ??? If you have persistent nausea or vomiting.  You or your caregiver should call 911 for severe symptoms such as excessive bleeding, chest pain, shortness of breath or loss of consciousness.  For any of the above symptoms or for problems or concerns related to the procedure,??????call [phone] for Monday-Friday 7-5.??? After-hours and weekends, please call??????[phone] and ask for the Interventional Radiology Resident on-call.

## 2019-01-12 NOTE — Other
Neuro Interventional Immediate Post Procedure Note    Date:  01/12/2019                                     Attending Physician:  Sandria Senter, MD  Assistant(s):  Benita Gutter.  Cerebral Angiogram  Time out performed: Consent obtained, correct patient verified, correct procedure verified, correct site verified, patient marked as necessary.    Indications:  F/u [facility] embo  Anesthesia: Conscious Sedation  Sedation/Medication Plan: Moderate sedation  Post Op Dx:  [facility]    Findings:  No significant filling of previously coiled [facility]    Estimated Blood Loss:  Less than 50 ml  Specimen(s) Removed/Disposition:  None  Complications: None  Comments:  none  Closure Device:  StarClose      Recommended Blood Pressure Parameters:  SBP <130 mmHg  Recommended Anticoagulants:  As pre angio    Neuro Exam:  Awake, alert, fluent speech    Sandria Senter, MD  Pager:  [phone]

## 2019-01-12 NOTE — Progress Notes
Contacted patient and confirmed name and DOB. Patient advised that COVID-19 test results are negative. Patient confirmed procedure has been completed.     Jocelyn Chavero, DNP, RN, CCRN, SCRN, CNRN

## 2019-01-12 NOTE — H&P (View-Only)
Pre Procedure History and Physical/Sedation Plan-OP    Procedure Date: 01/12/2019     Planned Procedure(s):  Cerebral angiogram for f/u [facility] s/p coil embolization     Indication for exam:  Evaluation of embolization  __________________________________________________________________    Chief Complaint:  [facility]    History of Present Illness: Jocelyn Nash is a 48 y.o. female.  trauma on 09/2017, HTN, migraine, and stroke post head trauma  Patient Active Problem List    Diagnosis Date Noted   ??? 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 Essential hypertension 06/01/2018   ??? Struck by horse 09/25/2017   ??? 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 History of mandibular surgery 09/22/2017   ??? Concussion with loss of consciousness of 30 minutes or less 09/20/2017   ??? Acute pain due to trauma 09/17/2017   ??? Open fracture of left side of mandible (HCC) 09/17/2017   ??? Chin laceration 09/17/2017   ??? Leukocytosis 09/17/2017   ??? Facial trauma 09/16/2017     Medical History:   Diagnosis Date   ??? Abdominal injury 2005    Following horse injury, liver laceration, injured spleen/kidney, collapsed lungs   ??? Angioedema     Takes Zyrtec daily   ??? Essential hypertension 06/01/2018   ??? Hypertension    ??? 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 Venereal disease       Surgical History:   Procedure Laterality Date   ??? WISDOM TEETH EXTRACTION  1996   ??? OPEN TREATMENT INTERNAL/ INTERDENTAL FIXATION MANDIBLE FRACTURE - COMPLICATED Left 09/19/2017    Performed by Netta Neat, MD at [facility]   ??? CESAREAN SECTION     ??? TONSILLECTOMY     ??? WRIST SURGERY Left       Medications Prior to Admission   Medication Sig Dispense Refill Last Dose   ??? acetaminophen (TYLENOL) 325 mg tablet Take two tablets by mouth every 6 hours as needed for Pain.  0 Taking   ??? amLODIPine (NORVASC) 10 mg tablet Take 10 mg by mouth at bedtime daily.   Taking ??? aspirin EC 81 mg tablet Take 81 mg by mouth at bedtime daily. Take with food.    Taking   ??? cetirizine (ZYRTEC) 10 mg tablet Take 10 mg by mouth at bedtime daily.   Taking   ??? inulin (FIBER GUMMIES) 2 gram chew Chew 4 g by mouth twice daily.   Taking   ??? magnesium oxide (MAG-OX) 400 mg (241.3 mg magnesium) tablet Take 400 mg by mouth daily.   Taking   ??? norethindrone/ethinyl estradiol(+) (NECON, NORTREL) 1 mg/35 mcg tablet Take 1 tablet by mouth at bedtime daily.   Taking   ??? potassium chloride (KLOR-CON 8) 8 mEq tablet Take 8 mEq by mouth daily.   Taking   ??? predniSONE (DELTASONE) 50 mg tablet Take one tablet by mouth as directed for 3 doses. 3 tablet 0    ??? valACYclovir (VALTREX) 500 mg tablet Take 500 mg by mouth at bedtime daily.   Taking     Allergies   Allergen Reactions   ??? Contrast Dye Iv, Iodine Containing [Iodinated Contrast Media] DIARRHEA and SEE COMMENTS     Malaise   ??? Latex, Natural Rubber HIVES   ??? Dilaudid [Hydromorphone] NAUSEA AND VOMITING   ??? Morphine NAUSEA AND VOMITING   ??? Shrimp DIARRHEA       Social History:  Social History     Tobacco Use   ??? Smoking status: Never Smoker   ??? Smokeless tobacco: Never Used   Substance Use Topics   ??? Alcohol use: Yes     Comment: socially, maybe 1-2 glasses wine/beer per week      No family history on file.     Review of Systems  Constitutional: negative  Ears, nose, mouth, throat, and face: negative  Respiratory: negative  Cardiovascular: negative  Gastrointestinal: negative  Musculoskeletal:negative  Neurological:no new symptoms reported  Behavioral/Psych: negative    Previous Anesthetic/Sedation History:  Reviewed    Physical Exam:  Vital Signs: Last Filed In 24 Hours Vital Signs: 24 Hour Range                General appearance: alert and no distress  Neurologic: Grossly normal, at baseline  Lungs: Nonlabored with normal effort  Heart: regular rate and rhythm  Abdomen: soft, non-tender. Bowel sounds normal. No masses,  no organomegaly Extremities: extremities normal, atraumatic, no cyanosis or edema       Airway:  airway assessment performed  Mallampati II (soft palate, uvula, fauces visible)   Anesthesia Classification:  ASA III (A patient with a severe systemic disease that limits activity, but is not incapacitating)  Pre procedure anxiolysis plan: Midazolam  Sedation/Medication Plan: Fentanyl, Lidocaine and Midazolam  Personal history of sedation complications: Denies adverse event.   Family history of sedation complications: Denies adverse event.   Medications for Reversal: Naloxone and Flumazenil  Discussion/Reviews:  Physician has discussed risks and alternatives of this type of sedation and above planned procedures with patient  NPO Status: Acceptable  Pregnancy Status: Not Pregnant    Pretreated for contrast allergy     Lab/Radiology/Other Diagnostic Tests:  Labs:  No pertinent labs           Philmore Pali, APRN-NP  Pager [phone]

## 2019-03-30 ENCOUNTER — Ambulatory Visit: Admit: 2019-03-30 | Discharge: 2019-03-30

## 2019-03-30 ENCOUNTER — Encounter: Admit: 2019-03-30 | Discharge: 2019-03-30

## 2019-03-30 DIAGNOSIS — A64 Unspecified sexually transmitted disease: Secondary | ICD-10-CM

## 2019-03-30 DIAGNOSIS — I10 Essential (primary) hypertension: Secondary | ICD-10-CM

## 2019-03-30 DIAGNOSIS — G459 Transient cerebral ischemic attack, unspecified: Secondary | ICD-10-CM

## 2019-03-30 DIAGNOSIS — I671 Cerebral aneurysm, nonruptured: Secondary | ICD-10-CM

## 2019-03-30 DIAGNOSIS — S3991XA Unspecified injury of abdomen, initial encounter: Secondary | ICD-10-CM

## 2019-03-30 DIAGNOSIS — T783XXA Angioneurotic edema, initial encounter: Secondary | ICD-10-CM

## 2019-03-30 NOTE — Progress Notes
Date of Service: 03/30/2019    Subjective:             Jocelyn Nash is a 48 y.o. female here for stroke management.     History of Present Illness  ARNIE MAIOLO is a 48 y.o. 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 Patient has had residual diplopia and visual symptoms due to CCF, but denies residual symptoms due to stroke. Visual symptoms continued to improve with time. Patient had recent eye surgery with significant improvement (less diplopia when looking to the right). She denies recent eye swelling but mild redness after surgery and double vision is still present occasionally. Patient denies recent stroke symptoms. She is able to walk without assistance and denies any recent falls or memory problems. Recent angiogram showed minimal/reduced filling of the previously coiled CCF. Patient has been taking aspirin for stroke prevention. Patient denies recent mood changes or sleep problems.         Review of Systems   Constitutional: Positive for appetite change.   Eyes: Positive for photophobia and redness.   Cardiovascular: Positive for leg swelling.   Musculoskeletal: Positive for neck pain.   Allergic/Immunologic: Positive for environmental allergies.     Medical History:   Diagnosis Date   ??? Abdominal injury 2005    Following horse injury, liver laceration, injured spleen/kidney, collapsed lungs   ??? Angioedema     Takes Zyrtec daily   ??? Essential hypertension 06/01/2018   ??? Hypertension    ??? 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 Venereal disease        Surgical History:   Procedure Laterality Date   ??? WISDOM TEETH EXTRACTION  1996   ??? OPEN TREATMENT INTERNAL/ INTERDENTAL FIXATION MANDIBLE FRACTURE - COMPLICATED Left 09/19/2017    Performed by Netta Neat, MD at [facility]   ??? CESAREAN SECTION     ??? TONSILLECTOMY ??? WRIST SURGERY Left        Social History     Socioeconomic History   ??? Marital status: Married     Spouse name: Not on file   ??? Number of children: Not on file   ??? Years of education: Not on file   ??? Highest education level: Not on file   Occupational History   ??? Not on file   Tobacco Use   ??? Smoking status: Never Smoker   ??? Smokeless tobacco: Never Used   Substance and Sexual Activity   ??? Alcohol use: Yes     Comment: socially, maybe 1-2 glasses wine/beer per week   ??? Drug use: No   ??? Sexual activity: Not Currently     Partners: Male   Other Topics Concern   ??? Not on file   Social History Narrative   ??? Not on file       History reviewed. No pertinent family history.    ALLERGIES  Allergies   Allergen Reactions   ??? Contrast Dye Iv, Iodine Containing [Iodinated Contrast Media] DIARRHEA and SEE COMMENTS     Malaise   ??? Latex, Natural Rubber HIVES   ??? Dilaudid [Hydromorphone] NAUSEA AND VOMITING   ??? Morphine NAUSEA AND VOMITING   ??? Shrimp DIARRHEA     Objective:         ??? acetaminophen (TYLENOL)  325 mg tablet Take two tablets by mouth every 6 hours as needed for Pain.   ??? amLODIPine (NORVASC) 10 mg tablet Take 10 mg by mouth at bedtime daily.   ??? aspirin EC 81 mg tablet Take 81 mg by mouth at bedtime daily. Take with food.    ??? cetirizine (ZYRTEC) 10 mg tablet Take 10 mg by mouth at bedtime daily.   ??? inulin (FIBER GUMMIES) 2 gram chew Chew 4 g by mouth twice daily.   ??? magnesium oxide (MAG-OX) 400 mg (241.3 mg magnesium) tablet Take 400 mg by mouth daily.   ??? norethindrone/ethinyl estradiol(+) (NECON, NORTREL) 1 mg/35 mcg tablet Take 1 tablet by mouth at bedtime daily.   ??? potassium chloride (KLOR-CON 8) 8 mEq tablet Take 8 mEq by mouth daily.   ??? predniSONE (DELTASONE) 50 mg tablet Take one tablet by mouth as directed for 3 doses.   ??? valACYclovir (VALTREX) 500 mg tablet Take 500 mg by mouth at bedtime daily.     Vitals:    03/30/19 1510   BP: 126/80   BP Source: Arm, Right Upper   Patient Position: Sitting Pulse: 65     There is no height or weight on file to calculate BMI.     Physical Exam  GENERAL APPEARANCE: The patient is alert. Patient is in no acute distress, following commands and cooperative. Well developed and well nourished.   HEENT: Normocephalic and atraumatic.   EXTREMITIES: no leg swelling.    NEUROLOGIC EXAM:   Orientation: The patient is alert and oriented times three. Patient is following commands. Speech is fluent, intact comprehension, repetition and naming.     Cranial nerves:  1st cranial nerve:  not tested   2nd cranial nerve: normal; Visual fields are full to confrontation. Pupils are equal, round, and reactive to light and accommodation.   3rd, 4th & 6th cranial nerves: Extraocular movements are intact with minimal nystagmus on right gaze.  5th cranial nerve: normal; intact muscles of mastication. Intact light touch and pin prick.  7th cranial nerve: normal; no facial asymmetry  8th cranial nerve: normal  9th & 10th cranial nerves: normal; Gag is present   11th cranial nerve: normal; Shoulder shrug symmetric   12th cranial nerve: normal; tongue is midline.      Strength: (Right/Left) Deltoid 5/5, Biceps 5/5, Triceps 5/5, Finger ext 5/5, interossei 5/5, Hip Flexion 5/5, Knee ext 5/5, Knee flex 5/5, Ankle dorsiflexion 5/5, Ankle plantarflexion 5/5. Normal bulk and tone in all four limbs without any evidence of an arm drift.  No abnormal movements during exam.  Sensory: Intact to pain, temperature and vibration in both upper/lower extremities.   Coordination is intact finger-to-nose and rapidly alternating movements.    Deep tendon reflexes are 2+ in biceps, triceps, brachioradialis and patellar bilaterally and 1+ ankle reflex.  Hoffman sign is absent bilaterally. Clonus was not elicited.   Gait is normal based without ataxia. Good tandem walking.    LABS:  Sodium   Date Value Ref Range Status   06/02/2018 138 137 - 147 MMOL/L Final     Potassium   Date Value Ref Range Status 06/02/2018 3.6 3.5 - 5.1 MMOL/L Final     Chloride   Date Value Ref Range Status   06/02/2018 108 98 - 110 MMOL/L Final     CO2   Date Value Ref Range Status   06/02/2018 22 21 - 30 MMOL/L Final     Anion Gap   Date Value Ref  Range Status   06/02/2018 8 3 - 12 Final     Blood Urea Nitrogen   Date Value Ref Range Status   06/02/2018 12 7 - 25 MG/DL Final     Creatinine   Date Value Ref Range Status   06/02/2018 0.68 0.4 - 1.00 MG/DL Final     Glucose   Date Value Ref Range Status   06/02/2018 192 (H) 70 - 100 MG/DL Final   16/04/9603 78 70 - 110 MG/DL Final     Calcium   Date Value Ref Range Status   06/02/2018 8.5 8.5 - 10.6 MG/DL Final     AST (SGOT)   Date Value Ref Range Status   05/05/2018 19 7 - 40 U/L Final     ALT (SGPT)   Date Value Ref Range Status   05/05/2018 9 7 - 56 U/L Final     Alk Phosphatase   Date Value Ref Range Status   05/05/2018 86 25 - 110 U/L Final     Albumin   Date Value Ref Range Status   05/05/2018 4.4 3.5 - 5.0 G/DL Final     Total Bilirubin   Date Value Ref Range Status   05/05/2018 0.3 0.3 - 1.2 MG/DL Final      Assessment and Plan:  Ischemic stroke (due to dissection) and CCF s/p coil embolization. Patient has significantly recovered and she still has mild occasional diplopia, her symptoms improved after coil embolization as well as recent eye surgery. She denies recurrent stroke symptoms and takes aspirin for stroke prevention. Her neurological examination today shows minimal visual deficits, but overall findings have improved with time. Patient may keep taking similar medication for stroke prevention for now,    Recommendations:  1. Continue with aspirin for secondary stroke prevention.  2. Patient advised to follow up with PCP and ophthalmology. She may return to our clinic on as needed basis in the future.     After a discussion, the patient agrees with the plan.

## 2019-09-21 IMAGING — CR CHEST
1 series · 1 of 1 positions shown · non-contrast
Comparison: none

[chest port x-wise]
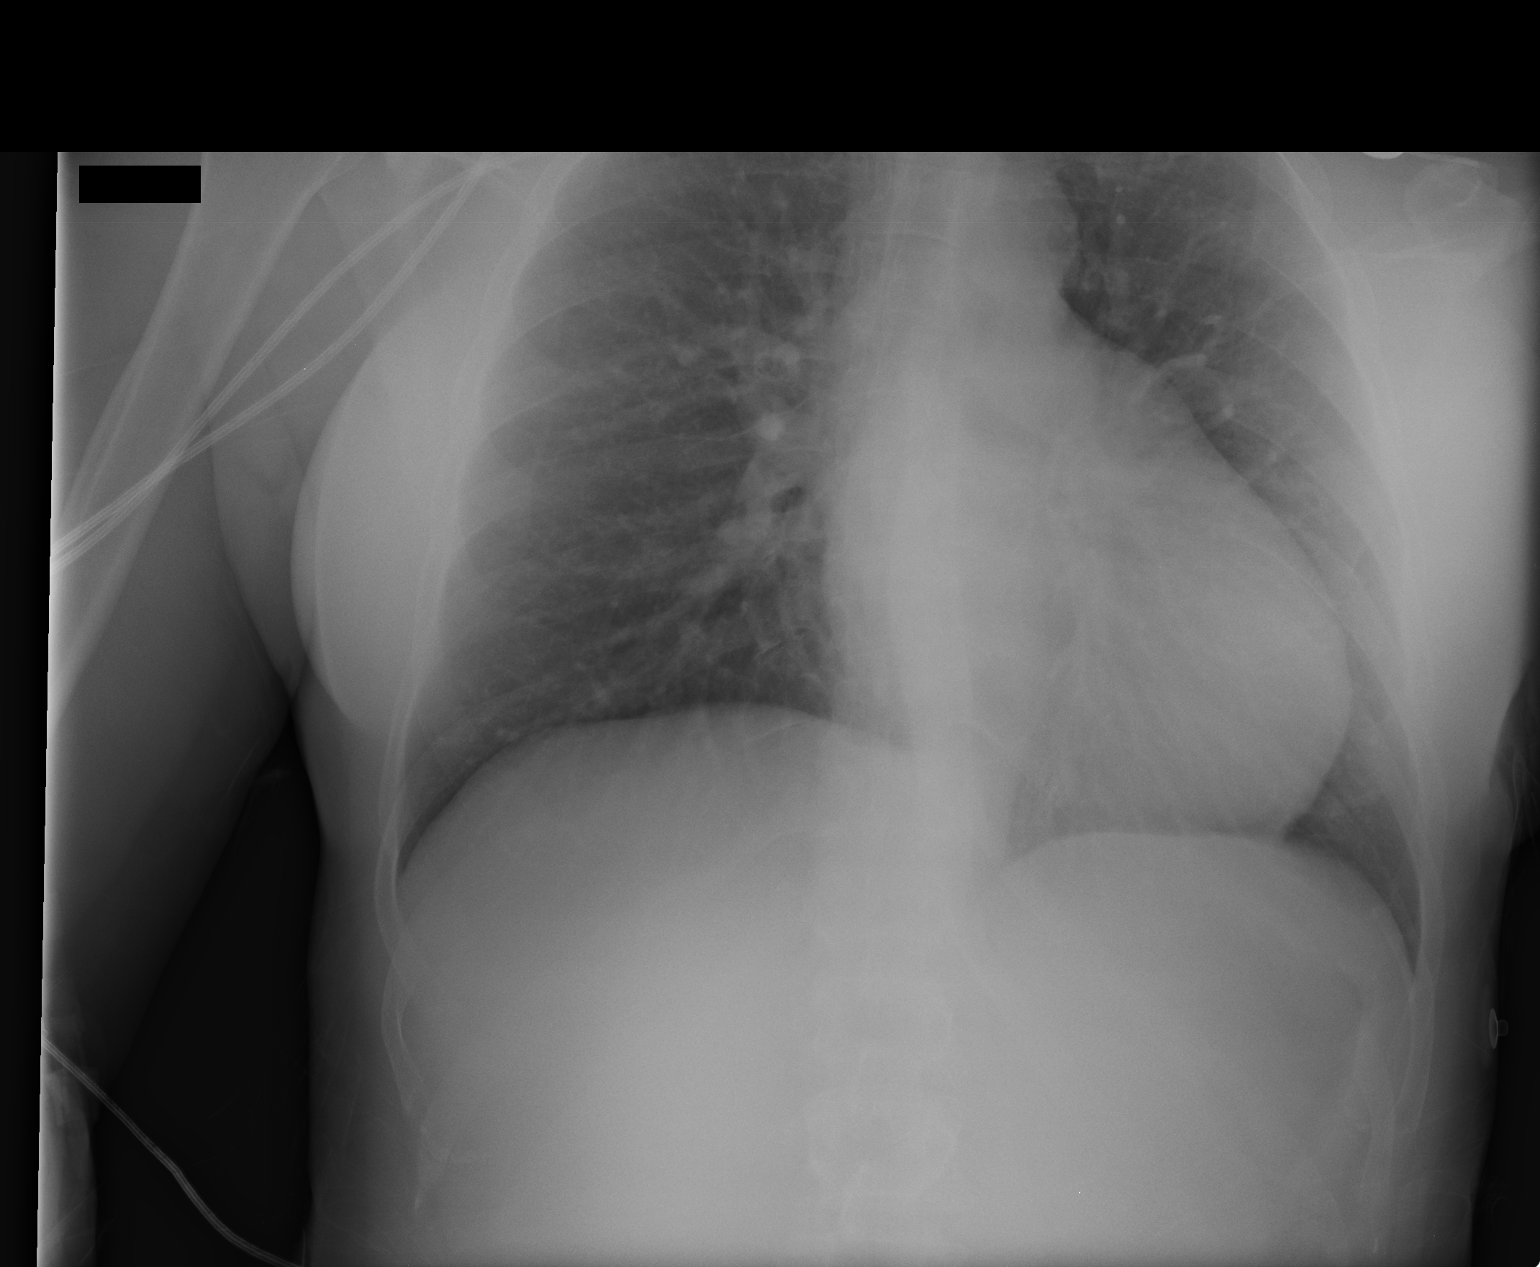

[1 of 1 positions shown; findings below may reference images not displayed]

EXAM
Portable chest x-ray.

INDICATION
ASPIRATION-TRAUMA
POSSIBLE ASPIRATION AFTER VOMITTING. KICKED BY HORSE, FX LT MANDIBLE. HB

FINDINGS
A single portable view of the chest was obtained.
The lungs are clear. The heart size and pulmonary vascularity are within normal limits.

IMPRESSION
There is no significant radiographic abnormality of the chest.

## 2022-04-16 ENCOUNTER — Encounter: Admit: 2022-04-16 | Discharge: 2022-04-16 | Payer: 59

## 2023-06-06 ENCOUNTER — Encounter: Admit: 2023-06-06 | Discharge: 2023-06-06 | Payer: PRIVATE HEALTH INSURANCE

## 2024-04-23 ENCOUNTER — Encounter: Admit: 2024-04-23 | Discharge: 2024-04-23 | Payer: PRIVATE HEALTH INSURANCE
